# Patient Record
Sex: Female | Born: 1985 | Race: White | Hispanic: No | Marital: Single | Smoking: Former smoker
Health system: Southern US, Community
[De-identification: ages and names within clinical notes are randomized; demographics above are authoritative.]

## PROBLEM LIST (undated history)

## (undated) DIAGNOSIS — K589 Irritable bowel syndrome without diarrhea: Secondary | ICD-10-CM

## (undated) DIAGNOSIS — K2100 Gastro-esophageal reflux disease with esophagitis, without bleeding: Secondary | ICD-10-CM

## (undated) DIAGNOSIS — D649 Anemia, unspecified: Secondary | ICD-10-CM

## (undated) DIAGNOSIS — F319 Bipolar disorder, unspecified: Secondary | ICD-10-CM

---

## 2017-02-11 ENCOUNTER — Ambulatory Visit (HOSPITAL_COMMUNITY)
Admission: EM | Admit: 2017-02-11 | Discharge: 2017-02-11 | Disposition: A | Discharge status: Home / Self Care | Payer: Medicaid Other | Attending: Internal Medicine | Admitting: Internal Medicine

## 2017-02-11 ENCOUNTER — Encounter (HOSPITAL_COMMUNITY): Payer: Self-pay | Admitting: Emergency Medicine

## 2017-02-11 DIAGNOSIS — Z3202 Encounter for pregnancy test, result negative: Secondary | ICD-10-CM | POA: Diagnosis not present

## 2017-02-11 DIAGNOSIS — R109 Unspecified abdominal pain: Secondary | ICD-10-CM | POA: Diagnosis not present

## 2017-02-11 DIAGNOSIS — K219 Gastro-esophageal reflux disease without esophagitis: Secondary | ICD-10-CM

## 2017-02-11 DIAGNOSIS — R110 Nausea: Secondary | ICD-10-CM

## 2017-02-11 HISTORY — DX: Irritable bowel syndrome, unspecified: K58.9

## 2017-02-11 HISTORY — DX: Gastro-esophageal reflux disease with esophagitis: K21.0

## 2017-02-11 HISTORY — DX: Anemia, unspecified: D64.9

## 2017-02-11 HISTORY — DX: Bipolar disorder, unspecified: F31.9

## 2017-02-11 HISTORY — DX: Gastro-esophageal reflux disease with esophagitis, without bleeding: K21.00

## 2017-02-11 LAB — POCT PREGNANCY, URINE: Preg Test, Ur: NEGATIVE

## 2017-02-11 MED ORDER — RANITIDINE HCL 300 MG PO TABS: 300.0000 mg | ORAL_TABLET | Freq: Every day | ORAL | 2 refills | Status: DC

## 2017-02-11 MED ORDER — OMEPRAZOLE 40 MG PO CPDR: 40.0000 mg | DELAYED_RELEASE_CAPSULE | Freq: Two times a day (BID) | ORAL | 0 refills | Status: DC

## 2017-02-11 MED ORDER — ONDANSETRON 4 MG PO TBDP: 4.0000 mg | ORAL_TABLET | Freq: Three times a day (TID) | ORAL | 0 refills | Status: DC | PRN

## 2017-02-11 NOTE — Discharge Instructions (Signed)
Treating you for acid reflux, given Zantac, take one tablet every night at bedtime. Also started on Prilosec, take one tablet twice a day for the next 2 weeks. For your nausea, given Zofran, he Starzyk take 1 tablet underneath the tongue every 8 hours as needed. Recommend you follow-up with primary care provider for further evaluation and management of your condition.

## 2017-02-11 NOTE — ED Triage Notes (Signed)
The patient presented to the [facility] with a complaint of abdominal cramping and nausea after eating. The patient reported a hx of IBS and reflux.

## 2017-02-11 NOTE — ED Provider Notes (Signed)
CSN: [id number]     Arrival date & time 02/11/17  1424 History   None    Chief Complaint  Patient presents with  . Abdominal Pain   (Consider location/radiation/quality/duration/timing/severity/associated sxs/prior Treatment) 31 year old female presents to clinic with a one-week history of worsening acid reflux. She has a prior history of GERD, treated with Prilosec in the past. She also has a past history of H. pylori, is been treated for this disease twice as well.    Gastroesophageal Reflux  This is a recurrent problem. The current episode started more than 1 week ago. The problem occurs constantly. The problem has not changed since onset.Associated symptoms include abdominal pain. Pertinent negatives include no chest pain, no headaches and no shortness of breath. Exacerbated by: food, supine position. Relieved by: antacids, prilosec. The treatment provided mild relief.    Past Medical History:  Diagnosis Date  . Anemia   . Bipolar 1 disorder (HCC)   . IBS (irritable bowel syndrome)   . Reflux esophagitis    History reviewed. No pertinent surgical history. History reviewed. No pertinent family history. Social History  Substance Use Topics  . Smoking status: Never Smoker  . Smokeless tobacco: Never Used  . Alcohol use No   OB History    No data available     Review of Systems  Constitutional: Negative.   HENT: Negative.   Respiratory: Negative for cough, shortness of breath and wheezing.   Cardiovascular: Negative for chest pain and palpitations.  Gastrointestinal: Positive for abdominal pain and nausea.  Genitourinary: Negative.   Musculoskeletal: Negative.   Skin: Negative.   Neurological: Negative for dizziness and headaches.    Allergies  Ultram [tramadol] and Bactrim [sulfamethoxazole-trimethoprim]  Home Medications   Prior to Admission medications   Medication Sig Start Date End Date Taking? Authorizing Provider  Cariprazine HCl (VRAYLAR) 1.5 MG CAPS Take  by mouth.   Yes Historical Provider, MD  clonazePAM (KLONOPIN) 1 MG tablet Take 1 mg by mouth 2 (two) times daily.   Yes Historical Provider, MD  omeprazole (PRILOSEC) 40 MG capsule Take 1 capsule (40 mg total) by mouth 2 (two) times daily. 02/11/17 02/25/17  Dorena Bodo, NP  ondansetron (ZOFRAN ODT) 4 MG disintegrating tablet Take 1 tablet (4 mg total) by mouth every 8 (eight) hours as needed for nausea or vomiting. 02/11/17   Dorena Bodo, NP  ranitidine (ZANTAC) 300 MG tablet Take 1 tablet (300 mg total) by mouth at bedtime. 02/11/17   Dorena Bodo, NP   Meds Ordered and Administered this Visit  Medications - No data to display  BP 104/69 (BP Location: Left Arm)   Pulse 62   Temp 98 F (36.7 C) (Oral)   Resp 16   SpO2 100%  No data found.   Physical Exam  Constitutional: She is oriented to person, place, and time. She appears well-developed and well-nourished. No distress.  HENT:  Head: Normocephalic and atraumatic.  Right Ear: External ear normal.  Left Ear: External ear normal.  Eyes: Conjunctivae are normal. Right eye exhibits no discharge. Left eye exhibits no discharge.  Neck: Normal range of motion.  Abdominal: Soft. She exhibits no distension. There is no tenderness. There is no rebound.  Neurological: She is alert and oriented to person, place, and time.  Skin: Skin is warm and dry. Capillary refill takes less than 2 seconds. No rash noted. She is not diaphoretic. No erythema.  Psychiatric: She has a normal mood and affect. Her behavior is normal.  Nursing  note and vitals reviewed.   Urgent Care Course     Procedures (including critical care time)  Labs Review Labs Reviewed  POCT PREGNANCY, URINE    Imaging Review No results found.      MDM   1. Gastroesophageal reflux disease, esophagitis presence not specified    Treating for acid reflux, there is possibility of record of H. pylori, refer her back to primary care for evaluation of this  condition. Treating tonight with Prilosec, Zofran, and Zantac. Recommend avoiding spicy foods, follow-up with primary care or return to clinic as needed.     Dorena Bodo, NP 02/11/17 2216

## 2017-04-26 ENCOUNTER — Emergency Department (HOSPITAL_BASED_OUTPATIENT_CLINIC_OR_DEPARTMENT_OTHER)
Admission: EM | Admit: 2017-04-26 | Discharge: 2017-04-26 | Discharge status: Against Medical Advice | Payer: Medicaid Other | Attending: Emergency Medicine | Admitting: Emergency Medicine

## 2017-04-26 ENCOUNTER — Encounter (HOSPITAL_BASED_OUTPATIENT_CLINIC_OR_DEPARTMENT_OTHER): Payer: Self-pay | Admitting: *Deleted

## 2017-04-26 DIAGNOSIS — R102 Pelvic and perineal pain: Secondary | ICD-10-CM | POA: Insufficient documentation

## 2017-04-26 DIAGNOSIS — Z79899 Other long term (current) drug therapy: Secondary | ICD-10-CM | POA: Insufficient documentation

## 2017-04-26 LAB — URINALYSIS, ROUTINE W REFLEX MICROSCOPIC
BILIRUBIN URINE: NEGATIVE
GLUCOSE, UA: NEGATIVE mg/dL
HGB URINE DIPSTICK: NEGATIVE
Ketones, ur: NEGATIVE mg/dL
Leukocytes, UA: NEGATIVE
Nitrite: NEGATIVE
PROTEIN: NEGATIVE mg/dL
SPECIFIC GRAVITY, URINE: 1.025 (ref 1.005–1.030)
pH: 6 (ref 5.0–8.0)

## 2017-04-26 LAB — PREGNANCY, URINE: PREG TEST UR: NEGATIVE

## 2017-04-26 LAB — WET PREP, GENITAL
CLUE CELLS WET PREP: NONE SEEN
Sperm: NONE SEEN
Trich, Wet Prep: NONE SEEN
Yeast Wet Prep HPF POC: NONE SEEN

## 2017-04-26 NOTE — ED Provider Notes (Signed)
[facility] Provider Note   CSN: [id number] Arrival date & time: 04/26/17  1751 By signing my name below, I, Levon HedgerElizabeth Hall, attest that this documentation has been prepared under the direction and in the presence of Makyra Corprew, Neysa Bonitoana Duo, MD . Electronically Signed: Levon HedgerElizabeth Hall, Scribe. 04/26/2017. 7:42 PM.   History   Chief Complaint Chief Complaint  Patient presents with  . Vaginal Discharge   HPI Maralyn SagoSarah Pineda is a 31 y.o. female who presents to the Emergency Department complaining of 10/10 LLQ abdominal pain onset two days ago. Pain is exacerbated by lying down and with moment; no alleviating factors noted. She reports associated nausea, malodorous vaginal discharge, increased urinary frequency and decreased urinary output. She has taken AZO with no significant relief of pain. Per pt, she has had bacterial vaginosis several times in the past and this feels similar. Pt denies any fevers, back pain or vomiting. Pt has no other acute complaints or associated symptoms at this time.She denies any concerns for STDs. Is sexually active with same sex partner.  The history is provided by the patient. No language interpreter was used.   Past Medical History:  Diagnosis Date  . Anemia   . Bipolar 1 disorder (HCC)   . IBS (irritable bowel syndrome)   . Reflux esophagitis     There are no active problems to display for this patient.   History reviewed. No pertinent surgical history.  OB History    No data available       Home Medications    Prior to Admission medications   Medication Sig Start Date End Date Taking? Authorizing Provider  Cariprazine HCl (VRAYLAR) 1.5 MG CAPS Take by mouth.   Yes [provider]  clonazePAM (KLONOPIN) 1 MG tablet Take 1 mg by mouth 2 (two) times daily.   Yes [provider]  GABAPENTIN PO Take by mouth.   Yes [provider]  NAPROXEN PO Take by mouth.   Yes [provider]  omeprazole (PRILOSEC) 40 MG capsule  Take 1 capsule (40 mg total) by mouth 2 (two) times daily. 02/11/17 04/26/17 Yes Dorena BodoKennard, Lawrence, NP  ranitidine (ZANTAC) 300 MG tablet Take 1 tablet (300 mg total) by mouth at bedtime. 02/11/17  Yes Dorena BodoKennard, Lawrence, NP  ondansetron (ZOFRAN ODT) 4 MG disintegrating tablet Take 1 tablet (4 mg total) by mouth every 8 (eight) hours as needed for nausea or vomiting. 02/11/17   Dorena BodoKennard, Lawrence, NP    Family History No family history on file.  Social History Social History  Substance Use Topics  . Smoking status: Never Smoker  . Smokeless tobacco: Never Used  . Alcohol use No    Allergies   Ultram [tramadol] and Bactrim [sulfamethoxazole-trimethoprim]  Review of Systems Review of Systems  Gastrointestinal: Positive for abdominal pain and nausea. Negative for vomiting.  Genitourinary: Positive for decreased urine volume, frequency and vaginal discharge.  Musculoskeletal: Negative for back pain.  All other systems reviewed and are negative.  Physical Exam Updated Vital Signs BP 110/71 (BP Location: Right Arm)   Pulse 69   Temp 99 F (37.2 C) (Oral)   Resp 16   Ht 5\' 6"  (1.676 m)   Wt 80.4 kg (177 lb 3 oz)   SpO2 100%   BMI 28.60 kg/m   Physical Exam Physical Exam  Nursing note and vitals reviewed. Constitutional: Well developed, well nourished, non-toxic, and in no acute distress Head: Normocephalic and atraumatic.  Mouth/Throat: Oropharynx is clear and moist.  Neck: Normal  range of motion. Neck supple.  Cardiovascular: Normal rate and regular rhythm.   Pulmonary/Chest: Effort normal and breath sounds normal.  Abdominal: Soft. Left lower pelvic tenderness. No CVA tenderness. There is no rebound and no guarding.  Pelvic: Normal external genitalia. Normal internal genitalia. No discharge. No blood within the vagina. No cervical motion tenderness. No adnexal masses. There is left-sided adnexal tenderness. Musculoskeletal: Normal range of motion.  Neurological: Alert, no  facial droop, fluent speech, moves all extremities symmetrically Skin: Skin is warm and dry.  Psychiatric: Cooperative   ED Treatments / Results  DIAGNOSTIC STUDIES:  Oxygen Saturation is 97% on RA, normal by my interpretation.    COORDINATION OF CARE:  7:19 PM Discussed treatment plan with pt at bedside and pt agreed to plan.   Labs (all labs ordered are listed, but only abnormal results are displayed) Labs Reviewed  WET PREP, GENITAL - Abnormal; Notable for the following:       Result Value   WBC, Wet Prep HPF POC MANY (*)    All other components within normal limits  URINALYSIS, ROUTINE W REFLEX MICROSCOPIC  PREGNANCY, URINE  GC/CHLAMYDIA PROBE AMP () NOT AT [facility]    EKG  EKG Interpretation None       Radiology No results found.  Procedures Procedures (including critical care time)  Medications Ordered in ED Medications - No data to display   Initial Impression / Assessment and Plan / ED Course  I have reviewed the triage vital signs and the nursing notes.  Pertinent labs & imaging results that were available during my care of the patient were reviewed by me and considered in my medical decision making (see chart for details).     31 year old female who presents with left-sided pelvic pain and vaginal discharge over the past 2 days. nontoxic in no acute distress with stable vital signs. Abdomen is soft and nonsurgical. Primarily pain is in the left adnexa. No cervical motion tenderness. GC chlamydia testing is pending although she is not concerned for any STDs. She has no evidence of vaginitis on her wet prep. Discussed pelvic ultrasound, to rule out ovarian cyst or ovarian torsion. Discussed transfer to [facility] to get this done if there is no ultrasound availability tonight. Patient states that she does not want to have ultrasound done tonight and would rather go home and come back tomorrow if needed. Discussed that worse case scenario this  could be infection versus ovarian torsion, but could be life-threatening or affect her fertility. She expressed understanding of this. I also attempted to order ultrasound for her to be scheduled for tomorrow, but patient eloped prior to me following up with her.   Final Clinical Impressions(s) / ED Diagnoses   Final diagnoses:  Pelvic pain    New Prescriptions New Prescriptions   No medications on file   I personally performed the services described in this documentation, which was scribed in my presence. The recorded information has been reviewed and is accurate.    Lavera Guise, MD 04/26/17 2110

## 2017-04-26 NOTE — ED Triage Notes (Signed)
Vaginal discharge with an odor x 2 days. Abdominal pain. Bloated.

## 2017-04-26 NOTE — ED Notes (Signed)
Pt eating snack in lobby

## 2017-04-26 NOTE — ED Notes (Signed)
Per EDP pt didn't want to have a scheduled US done tonight, states will return tomorrow

## 2017-04-27 LAB — GC/CHLAMYDIA PROBE AMP (~~LOC~~) NOT AT ARMC
Chlamydia: NEGATIVE
NEISSERIA GONORRHEA: NEGATIVE

## 2018-03-15 ENCOUNTER — Other Ambulatory Visit: Payer: Self-pay

## 2018-03-15 ENCOUNTER — Emergency Department (HOSPITAL_BASED_OUTPATIENT_CLINIC_OR_DEPARTMENT_OTHER)
Admission: EM | Admit: 2018-03-15 | Discharge: 2018-03-15 | Disposition: A | Discharge status: Home / Self Care | Payer: Medicaid Other | Attending: Emergency Medicine | Admitting: Emergency Medicine

## 2018-03-15 ENCOUNTER — Encounter (HOSPITAL_BASED_OUTPATIENT_CLINIC_OR_DEPARTMENT_OTHER): Payer: Self-pay | Admitting: *Deleted

## 2018-03-15 DIAGNOSIS — Z79899 Other long term (current) drug therapy: Secondary | ICD-10-CM | POA: Diagnosis not present

## 2018-03-15 DIAGNOSIS — Z711 Person with feared health complaint in whom no diagnosis is made: Secondary | ICD-10-CM

## 2018-03-15 DIAGNOSIS — N898 Other specified noninflammatory disorders of vagina: Secondary | ICD-10-CM | POA: Diagnosis not present

## 2018-03-15 DIAGNOSIS — R350 Frequency of micturition: Secondary | ICD-10-CM | POA: Diagnosis present

## 2018-03-15 LAB — URINALYSIS, ROUTINE W REFLEX MICROSCOPIC
Bilirubin Urine: NEGATIVE
Glucose, UA: NEGATIVE mg/dL
HGB URINE DIPSTICK: NEGATIVE
Ketones, ur: NEGATIVE mg/dL
Leukocytes, UA: NEGATIVE
Nitrite: NEGATIVE
PROTEIN: NEGATIVE mg/dL
Specific Gravity, Urine: >1.03 — ABNORMAL HIGH (ref 1.005–1.030)
pH: 6 (ref 5.0–8.0)

## 2018-03-15 LAB — WET PREP, GENITAL
CLUE CELLS WET PREP: NONE SEEN
Sperm: NONE SEEN
TRICH WET PREP: NONE SEEN
Yeast Wet Prep HPF POC: NONE SEEN

## 2018-03-15 LAB — PREGNANCY, URINE: PREG TEST UR: NEGATIVE

## 2018-03-15 MED ORDER — CEFTRIAXONE SODIUM 250 MG IJ SOLR
250.0000 mg | Freq: Once | INTRAMUSCULAR | Status: AC
  Administered 2018-03-15: 250 mg via INTRAMUSCULAR
  Filled 2018-03-15: qty 250

## 2018-03-15 MED ORDER — AZITHROMYCIN 250 MG PO TABS
1000.0000 mg | ORAL_TABLET | Freq: Once | ORAL | Status: AC
Start: 2018-03-15 — End: 2018-03-15
  Administered 2018-03-15: 1000 mg via ORAL
  Filled 2018-03-15: qty 4

## 2018-03-15 MED ORDER — FLUCONAZOLE 150 MG PO TABS: 150.0000 mg | ORAL_TABLET | Freq: Every day | ORAL | 0 refills | Status: AC

## 2018-03-15 NOTE — ED Provider Notes (Signed)
[facility] EMERGENCY DEPARTMENT Provider Note   CSN: [id number] Arrival date & time: 03/15/18  1120     History   Chief Complaint Chief Complaint  Patient presents with  . Vaginal Discharge    HPI Melanie Pineda is a 32 y.o. female.  HPI  Melanie Pineda is a 32yo female with a history of IBS and bipolar disorder who presents to the emergency department for evaluation of urinary frequency, urgency and vaginal irritation.  Patient reports that her symptoms began 3 days ago.  States she has had to go to the bathroom more often, but only peeing a few drops.  States that she also has some mild suprapubic discomfort with urination.  Has had some vaginal irritation and itching.  She has not noticed any new vaginal discharge.  Has had some nausea, no vomiting.  Denies fevers, chills, flank pain, vaginal bleeding, hematuria, diarrhea, melena, hematochezia, lightheadedness or syncope.  States that she was recently tested for GC/chlamydia, HIV and syphilis at her gynecologist office a month ago.  Reports that she has one female sexual partner whom she denies regular condom use.  States that she would like to be tested for chlamydia and gonorrhea today as she is not sure if her partner is seeing other women.  She is not taking birth control, last menstrual period started 5/16.  Reports that she has had repeated episodes of BV in the past and has needed treatment four times this year.  Past Medical History:  Diagnosis Date  . Anemia   . Bipolar 1 disorder (HCC)   . IBS (irritable bowel syndrome)   . Reflux esophagitis     There are no active problems to display for this patient.   History reviewed. No pertinent surgical history.   OB History   None      Home Medications    Prior to Admission medications   Medication Sig Start Date End Date Taking? Authorizing Provider  Cariprazine HCl (VRAYLAR) 1.5 MG CAPS Take by mouth.    [provider]  clonazePAM (KLONOPIN) 1 MG tablet  Take 1 mg by mouth 2 (two) times daily.    [provider]  GABAPENTIN PO Take by mouth.    [provider]  NAPROXEN PO Take by mouth.    [provider]  omeprazole (PRILOSEC) 40 MG capsule Take 1 capsule (40 mg total) by mouth 2 (two) times daily. 02/11/17 04/26/17  Dorena Bodo, NP  ondansetron (ZOFRAN ODT) 4 MG disintegrating tablet Take 1 tablet (4 mg total) by mouth every 8 (eight) hours as needed for nausea or vomiting. 02/11/17   Dorena Bodo, NP  ranitidine (ZANTAC) 300 MG tablet Take 1 tablet (300 mg total) by mouth at bedtime. 02/11/17   Dorena Bodo, NP    Family History No family history on file.  Social History Social History   Tobacco Use  . Smoking status: Never Smoker  . Smokeless tobacco: Never Used  Substance Use Topics  . Alcohol use: No  . Drug use: Yes    Types: Marijuana     Allergies   Ultram [tramadol] and Bactrim [sulfamethoxazole-trimethoprim]   Review of Systems Review of Systems  Constitutional: Negative for chills and fever.  Eyes: Negative for visual disturbance.  Respiratory: Negative for shortness of breath.   Cardiovascular: Negative for chest pain.  Gastrointestinal: Positive for abdominal pain (suprapubic abdominal pain with urination) and nausea. Negative for blood in stool and vomiting.  Genitourinary: Positive for frequency and urgency. Negative  for difficulty urinating, dysuria, flank pain, genital sores, hematuria, menstrual problem, vaginal bleeding and vaginal discharge.  Musculoskeletal: Negative for back pain.  Skin: Negative for rash.  Neurological: Negative for syncope and light-headedness.  Psychiatric/Behavioral: Negative for agitation.     Physical Exam Updated Vital Signs BP (!) 116/96   Pulse 74   Temp 98.3 F (36.8 C) (Oral)   Resp 18   LMP 03/01/2018   SpO2 100%   Physical Exam  Constitutional: She is oriented to person, place, and time. She appears well-developed and  well-nourished. No distress.  Sitting at bedside in no apparent distress, nontoxic-appearing.  HENT:  Head: Normocephalic and atraumatic.  Mouth/Throat: Oropharynx is clear and moist. No oropharyngeal exudate.  Eyes: Pupils are equal, round, and reactive to light. Right eye exhibits no discharge. Left eye exhibits no discharge.  Neck: Neck supple.  Pulmonary/Chest: Effort normal. No respiratory distress.  Abdominal: Soft. Bowel sounds are normal. There is no tenderness.  Abdomen soft and nondistended.  Nontender to palpation. No guarding or rigidity.  No CVA tenderness.  Genitourinary:  Genitourinary Comments: Chaperone present for exam. White thick discharge present. No CMT. No adnexal masses, tenderness, or fullness.  No bleeding within vaginal vault.  Neurological: She is alert and oriented to person, place, and time. Coordination normal.  Skin: Skin is warm and dry. She is not diaphoretic.  Psychiatric: She has a normal mood and affect. Her behavior is normal.  Nursing note and vitals reviewed.    ED Treatments / Results  Labs (all labs ordered are listed, but only abnormal results are displayed) Labs Reviewed  WET PREP, GENITAL - Abnormal; Notable for the following components:      Result Value   WBC, Wet Prep HPF POC MANY (*)    All other components within normal limits  URINALYSIS, ROUTINE W REFLEX MICROSCOPIC - Abnormal; Notable for the following components:   Specific Gravity, Urine >1.030 (*)    All other components within normal limits  PREGNANCY, URINE  GC/CHLAMYDIA PROBE AMP ([facility]) NOT AT [facility]    EKG None  Radiology No results found.  Procedures Procedures (including critical care time)  Medications Ordered in ED Medications  cefTRIAXone (ROCEPHIN) injection 250 mg (250 mg Intramuscular Given 03/15/18 1316)  azithromycin (ZITHROMAX) tablet 1,000 mg (1,000 mg Oral Given 03/15/18 1315)     Initial Impression / Assessment and Plan / ED Course  I have  reviewed the triage vital signs and the nursing notes.  Pertinent labs & imaging results that were available during my care of the patient were reviewed by me and considered in my medical decision making (see chart for details).     UA without evidence of infection. Urine pregnancy negative. Wet prep with many WBCs, negative for trich, yeast and clue cells. She has GC/Chlamydia cultures pending. She declines testing for HIV, syphilis. Patient wishes to be treated with prophylactic Rocephin and azithromycin. Although wet prep negative for yeast, will treat with diflucan for potential yeast infection given exam findings of white thick discharge and vaginal itching complaint. No CMT or adnexal tenderness to suggest PID. Patient has been counseled that she has GC/chlamydia testing which is pending and she will need to inform all sexual partners if positive. Have given her information to the health department for future std concerns. Discussed return precautions and patient agrees and voices understanding to the above plan.   Final Clinical Impressions(s) / ED Diagnoses   Final diagnoses:  Concern about STD in female without  diagnosis  Vaginal itching    ED Discharge Orders        Ordered    fluconazole (DIFLUCAN) 150 MG tablet  Daily     03/15/18 1250       Kellie Shropshire, [place] 03/15/18 1743    Tegeler, Canary Brim, MD 03/16/18 [phone]

## 2018-03-15 NOTE — ED Triage Notes (Signed)
Vaginal redness and itching. States hx of BV frequently.

## 2018-03-15 NOTE — Discharge Instructions (Addendum)
You do not have a urinary tract infection.  You were treated for chlamydia and gonorrhea in the emergency department today.  Your testing will return in 2 to 3 days.  If positive you will receive a phone call.   I have listed information to the health department below.  Please follow-up for any future STD concerns.  Please take Diflucan for potential yeast infection.  Return to the ER if you have any new or concerning symptoms like vomiting that will not stop, fever greater than 100.4 F, worsening abdominal pain.

## 2018-03-15 NOTE — ED Notes (Signed)
ED Provider at bedside. 

## 2018-03-18 LAB — GC/CHLAMYDIA PROBE AMP (~~LOC~~) NOT AT ARMC
CHLAMYDIA, DNA PROBE: NEGATIVE
NEISSERIA GONORRHEA: NEGATIVE

## 2018-03-20 ENCOUNTER — Encounter (HOSPITAL_BASED_OUTPATIENT_CLINIC_OR_DEPARTMENT_OTHER): Payer: Self-pay | Admitting: Emergency Medicine

## 2018-03-20 ENCOUNTER — Emergency Department (HOSPITAL_BASED_OUTPATIENT_CLINIC_OR_DEPARTMENT_OTHER): Payer: No Typology Code available for payment source

## 2018-03-20 ENCOUNTER — Emergency Department (HOSPITAL_BASED_OUTPATIENT_CLINIC_OR_DEPARTMENT_OTHER)
Admission: EM | Admit: 2018-03-20 | Discharge: 2018-03-20 | Disposition: A | Discharge status: Home / Self Care | Payer: No Typology Code available for payment source | Attending: Emergency Medicine | Admitting: Emergency Medicine

## 2018-03-20 ENCOUNTER — Other Ambulatory Visit: Payer: Self-pay

## 2018-03-20 DIAGNOSIS — Y998 Other external cause status: Secondary | ICD-10-CM | POA: Insufficient documentation

## 2018-03-20 DIAGNOSIS — S161XXA Strain of muscle, fascia and tendon at neck level, initial encounter: Secondary | ICD-10-CM

## 2018-03-20 DIAGNOSIS — M7918 Myalgia, other site: Secondary | ICD-10-CM | POA: Diagnosis not present

## 2018-03-20 DIAGNOSIS — S199XXA Unspecified injury of neck, initial encounter: Secondary | ICD-10-CM | POA: Diagnosis present

## 2018-03-20 DIAGNOSIS — Z79899 Other long term (current) drug therapy: Secondary | ICD-10-CM | POA: Diagnosis not present

## 2018-03-20 DIAGNOSIS — F319 Bipolar disorder, unspecified: Secondary | ICD-10-CM | POA: Diagnosis not present

## 2018-03-20 DIAGNOSIS — Y9389 Activity, other specified: Secondary | ICD-10-CM | POA: Insufficient documentation

## 2018-03-20 DIAGNOSIS — M791 Myalgia, unspecified site: Secondary | ICD-10-CM

## 2018-03-20 DIAGNOSIS — Y92410 Unspecified street and highway as the place of occurrence of the external cause: Secondary | ICD-10-CM | POA: Insufficient documentation

## 2018-03-20 MED ORDER — NAPROXEN 250 MG PO TABS
500.0000 mg | ORAL_TABLET | Freq: Once | ORAL | Status: AC
  Administered 2018-03-20: 500 mg via ORAL
  Filled 2018-03-20: qty 2

## 2018-03-20 MED ORDER — METHOCARBAMOL 500 MG PO TABS
500.0000 mg | ORAL_TABLET | Freq: Once | ORAL | Status: AC
  Administered 2018-03-20: 500 mg via ORAL
  Filled 2018-03-20: qty 1

## 2018-03-20 MED ORDER — NAPROXEN 500 MG PO TABS: 500.0000 mg | ORAL_TABLET | Freq: Two times a day (BID) | ORAL | 0 refills | Status: DC | PRN

## 2018-03-20 MED ORDER — METHOCARBAMOL 500 MG PO TABS: 500.0000 mg | ORAL_TABLET | Freq: Two times a day (BID) | ORAL | 0 refills | Status: DC | PRN
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
## 2018-03-20 NOTE — ED Notes (Signed)
NAD at this time. Pt is stable and going home.  
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
## 2018-04-15 ENCOUNTER — Emergency Department (HOSPITAL_BASED_OUTPATIENT_CLINIC_OR_DEPARTMENT_OTHER)
Admission: EM | Admit: 2018-04-15 | Discharge: 2018-04-15 | Discharge status: Against Medical Advice | Payer: Medicaid Other | Attending: Emergency Medicine | Admitting: Emergency Medicine

## 2018-04-15 ENCOUNTER — Other Ambulatory Visit: Payer: Self-pay

## 2018-04-15 ENCOUNTER — Encounter (HOSPITAL_BASED_OUTPATIENT_CLINIC_OR_DEPARTMENT_OTHER): Payer: Self-pay | Admitting: *Deleted

## 2018-04-15 DIAGNOSIS — Z5321 Procedure and treatment not carried out due to patient leaving prior to being seen by health care provider: Secondary | ICD-10-CM | POA: Insufficient documentation

## 2018-04-15 DIAGNOSIS — R300 Dysuria: Secondary | ICD-10-CM | POA: Insufficient documentation

## 2018-04-15 LAB — URINALYSIS, ROUTINE W REFLEX MICROSCOPIC
Bilirubin Urine: NEGATIVE
Glucose, UA: NEGATIVE mg/dL
Hgb urine dipstick: NEGATIVE
Ketones, ur: NEGATIVE mg/dL
Leukocytes, UA: NEGATIVE
Nitrite: NEGATIVE
Protein, ur: NEGATIVE mg/dL
Specific Gravity, Urine: <1.005 — ABNORMAL LOW (ref 1.005–1.030)
pH: 7 (ref 5.0–8.0)

## 2018-04-15 NOTE — ED Triage Notes (Signed)
Dysuria and frequency. States she was seen 2 weeks ago with the same symptoms but she did not have a UTI.

## 2018-04-16 ENCOUNTER — Encounter (HOSPITAL_BASED_OUTPATIENT_CLINIC_OR_DEPARTMENT_OTHER): Payer: Self-pay

## 2018-04-16 ENCOUNTER — Other Ambulatory Visit: Payer: Self-pay

## 2018-04-16 ENCOUNTER — Emergency Department (HOSPITAL_BASED_OUTPATIENT_CLINIC_OR_DEPARTMENT_OTHER)
Admission: EM | Admit: 2018-04-16 | Discharge: 2018-04-16 | Disposition: A | Discharge status: Home / Self Care | Payer: Medicaid Other | Attending: Emergency Medicine | Admitting: Emergency Medicine

## 2018-04-16 DIAGNOSIS — N730 Acute parametritis and pelvic cellulitis: Secondary | ICD-10-CM

## 2018-04-16 DIAGNOSIS — N739 Female pelvic inflammatory disease, unspecified: Secondary | ICD-10-CM | POA: Insufficient documentation

## 2018-04-16 DIAGNOSIS — Z79899 Other long term (current) drug therapy: Secondary | ICD-10-CM | POA: Diagnosis not present

## 2018-04-16 DIAGNOSIS — R102 Pelvic and perineal pain: Secondary | ICD-10-CM | POA: Diagnosis present

## 2018-04-16 DIAGNOSIS — R112 Nausea with vomiting, unspecified: Secondary | ICD-10-CM

## 2018-04-16 DIAGNOSIS — R300 Dysuria: Secondary | ICD-10-CM

## 2018-04-16 LAB — URINALYSIS, ROUTINE W REFLEX MICROSCOPIC
Bilirubin Urine: NEGATIVE
Glucose, UA: NEGATIVE mg/dL
Hgb urine dipstick: NEGATIVE
Ketones, ur: NEGATIVE mg/dL
LEUKOCYTES UA: NEGATIVE
NITRITE: NEGATIVE
PH: 6 (ref 5.0–8.0)
PROTEIN: NEGATIVE mg/dL
Specific Gravity, Urine: <1.005 — ABNORMAL LOW (ref 1.005–1.030)

## 2018-04-16 LAB — PREGNANCY, URINE: PREG TEST UR: NEGATIVE

## 2018-04-16 MED ORDER — ONDANSETRON 4 MG PO TBDP: 4.0000 mg | ORAL_TABLET | Freq: Three times a day (TID) | ORAL | 0 refills | Status: DC | PRN

## 2018-04-16 MED ORDER — ONDANSETRON 4 MG PO TBDP
4.0000 mg | ORAL_TABLET | Freq: Once | ORAL | Status: AC
  Administered 2018-04-16: 4 mg via ORAL
  Filled 2018-04-16: qty 1

## 2018-04-16 MED ORDER — DOXYCYCLINE HYCLATE 100 MG PO CAPS: 100.0000 mg | ORAL_CAPSULE | Freq: Two times a day (BID) | ORAL | 0 refills | Status: DC

## 2018-04-16 MED ORDER — CEFTRIAXONE SODIUM 250 MG IJ SOLR
250.0000 mg | Freq: Once | INTRAMUSCULAR | Status: AC
  Administered 2018-04-16: 250 mg via INTRAMUSCULAR
  Filled 2018-04-16: qty 250

## 2018-04-16 MED ORDER — AZITHROMYCIN 250 MG PO TABS
1000.0000 mg | ORAL_TABLET | Freq: Once | ORAL | Status: AC
  Administered 2018-04-16: 1000 mg via ORAL
  Filled 2018-04-16: qty 4

## 2018-04-16 MED FILL — DOXYCYCLINE HYCLATE 100 MG: 100 | 14 days supply | Qty: 28 | Fill #0

## 2018-04-16 NOTE — Discharge Instructions (Addendum)
You have been treated for gonorrhea, chlamydia, and other pelvic organ infectious organisms in the ER, continue taking Doxycycline as directed until completed in order to completely treat the presumed Pelvic Inflammatory Disease. NO SEXUAL INTERCOURSE UNTIL YOU'RE FINISHED WITH THE ANTIBIOTICS, THIS WILL INVALIDATE YOUR TREATMENT HERE. Alternate between tylenol and ibuprofen as needed for pain. Use zofran as directed as needed for nausea. Stay well hydrated and get plenty of rest. Your symptoms could also be due to something called interstitial cystitis, so if your symptoms persist after this treatment then you should follow up with your OBGYN to discuss further possible testing.  Follow up with your regular OBGYN in 1 week for recheck of symptoms. [facility] (called [facility]) for any changes or worsening symptoms.

## 2018-04-16 NOTE — ED Provider Notes (Signed)
[facility] EMERGENCY DEPARTMENT Provider Note   CSN: [id number] Arrival date & time: 04/16/18  1158     History   Chief Complaint Chief Complaint  Patient presents with  . Urinary Frequency    HPI Melanie Pineda is a 32 y.o. female with a PMHx of anemia, bipolar 1 disorder, IBS, and reflux esophagitis, who presents to the ED with complaints of ongoing dysuria and pelvic pain that has been going on since her last visit on 03/15/18; she states that it transiently improved slightly but then got worse again about 4 days ago.  Chart review reveals that pt was seen 03/15/18 for similar complaints, had U/A that was completely unremarkable, pelvic was performed and wet prep showed many WBCs but negative otherwise, and GC/CT testing was negative.  She was given azithromycin and rocephin empirically, and discharged home with diflucan given her symptoms of vaginal itching and thick white d/c on pelvic exam.  She states that she never took the diflucan because "she didn't need it"; reports that initially her symptoms were a little better for "a while" but then a few days ago started coming back.  Symptoms include dysuria, urinary frequency and urgency, malodorous urine, pelvic/abdominal pain, nausea, and 5 episodes of nonbloody nonbilious emesis today.  She describes her pain as 7/10 constant crampy lower abdominal pain that radiates into her lower back, worse with urination, and somewhat improved with cranberry juice and cranberry pills.  She is sexually active with one female partner.  LMP was 03/28/2018.  She denies fevers, chills, CP, SOB, diarrhea/constipation, obstipation, melena, hematochezia, hematemesis, hematuria, vaginal itching, genital sores, vaginal bleeding/discharge, myalgias, arthralgias, numbness, tingling, focal weakness, or any other complaints at this time. Denies recent travel, sick contacts, suspicious food intake, EtOH use, NSAID use, or prior abd surgeries.   The history is provided  by the patient and medical records. No language interpreter was used.  Urinary Frequency  Associated symptoms include abdominal pain. Pertinent negatives include no chest pain and no shortness of breath.    Past Medical History:  Diagnosis Date  . Anemia   . Bipolar 1 disorder (HCC)   . IBS (irritable bowel syndrome)   . Reflux esophagitis     There are no active problems to display for this patient.   History reviewed. No pertinent surgical history.   OB History   None      Home Medications    Prior to Admission medications   Medication Sig Start Date End Date Taking? Authorizing Provider  busPIRone (BUSPAR) 10 MG tablet Take 10 mg by mouth 2 (two) times daily.    [provider]  Cariprazine HCl (VRAYLAR) 1.5 MG CAPS Take by mouth.    [provider]  clonazePAM (KLONOPIN) 1 MG tablet Take 1 mg by mouth 2 (two) times daily.    [provider]  GABAPENTIN PO Take by mouth.    [provider]  methocarbamol (ROBAXIN) 500 MG tablet Take 1 tablet (500 mg total) by mouth 2 (two) times daily as needed (Muscle soreness). 03/20/18   Ward, Chase PicketJaime Pilcher, PA-C  naproxen (NAPROSYN) 500 MG tablet Take 1 tablet (500 mg total) by mouth 2 (two) times daily as needed. 03/20/18   Ward, Chase PicketJaime Pilcher, PA-C  omeprazole (PRILOSEC) 40 MG capsule Take 1 capsule (40 mg total) by mouth 2 (two) times daily. 02/11/17 04/26/17  Dorena BodoKennard, Lawrence, NP  ondansetron (ZOFRAN ODT) 4 MG disintegrating tablet Take 1 tablet (4 mg total) by mouth every 8 (eight)  hours as needed for nausea or vomiting. 02/11/17   Dorena Bodo, NP  ranitidine (ZANTAC) 300 MG tablet Take 1 tablet (300 mg total) by mouth at bedtime. 02/11/17   Dorena Bodo, NP    Family History No family history on file.  Social History Social History   Tobacco Use  . Smoking status: Never Smoker  . Smokeless tobacco: Never Used  Substance Use Topics  . Alcohol use: No  . Drug use: Yes    Types:  Marijuana     Allergies   Ultram [tramadol] and Bactrim [sulfamethoxazole-trimethoprim]   Review of Systems Review of Systems  Constitutional: Negative for chills and fever.  Respiratory: Negative for shortness of breath.   Cardiovascular: Negative for chest pain.  Gastrointestinal: Positive for abdominal pain, nausea and vomiting. Negative for blood in stool, constipation and diarrhea.  Genitourinary: Positive for dysuria, frequency and urgency. Negative for genital sores, hematuria, vaginal bleeding, vaginal discharge and vaginal pain.       +malodorous urine  Musculoskeletal: Negative for arthralgias and myalgias.  Skin: Negative for color change.  Allergic/Immunologic: Negative for immunocompromised state.  Neurological: Negative for weakness and numbness.  Psychiatric/Behavioral: Negative for confusion.   All other systems reviewed and are negative for acute change except as noted in the HPI.    Physical Exam Updated Vital Signs BP 112/76 (BP Location: Left Arm)   Pulse 66   Temp 98.1 F (36.7 C) (Oral)   Resp 20   Ht 5\' 6"  (1.676 m)   Wt 68 kg (150 lb)   LMP 03/28/2018   SpO2 100%   BMI 24.21 kg/m   Physical Exam  Constitutional: She is oriented to person, place, and time. Vital signs are normal. She appears well-developed and well-nourished.  Non-toxic appearance. No distress.  Afebrile, nontoxic, NAD  HENT:  Head: Normocephalic and atraumatic.  Mouth/Throat: Oropharynx is clear and moist and mucous membranes are normal.  Eyes: Conjunctivae and EOM are normal. Right eye exhibits no discharge. Left eye exhibits no discharge.  Neck: Normal range of motion. Neck supple.  Cardiovascular: Normal rate, regular rhythm, normal heart sounds and intact distal pulses. Exam reveals no gallop and no friction rub.  No murmur heard. Pulmonary/Chest: Effort normal and breath sounds normal. No respiratory distress. She has no decreased breath sounds. She has no wheezes. She has  no rhonchi. She has no rales.  Abdominal: Soft. Normal appearance and bowel sounds are normal. She exhibits no distension. There is tenderness in the suprapubic area. There is no rigidity, no rebound, no guarding, no CVA tenderness, no tenderness at McBurney's point and negative Murphy's sign.  Soft, nondistended, +BS throughout, with very mild suprapubic TTP, no r/g/r, neg murphy's, neg mcburney's, no frank CVA TTP   Genitourinary:  Genitourinary Comments: Pt declined  Musculoskeletal: Normal range of motion.  Neurological: She is alert and oriented to person, place, and time. She has normal strength. No sensory deficit.  Skin: Skin is warm, dry and intact. No rash noted.  Psychiatric: She has a normal mood and affect.  Nursing note and vitals reviewed.    ED Treatments / Results  Labs (all labs ordered are listed, but only abnormal results are displayed) Labs Reviewed  URINALYSIS, ROUTINE W REFLEX MICROSCOPIC - Abnormal; Notable for the following components:      Result Value   Specific Gravity, Urine <1.005 (*)    All other components within normal limits  URINE CULTURE  PREGNANCY, URINE   Labs from 03/15/18:  Ref. Range  04/26/2017 19:17 03/15/2018 00:00  Chlamydia Unknown  Negative  Neisseria gonorrhea Unknown  Negative  Yeast Wet Prep HPF POC Latest Ref Range: NONE SEEN  NONE SEEN   Trich, Wet Prep Latest Ref Range: NONE SEEN  NONE SEEN   Clue Cells Wet Prep HPF POC Latest Ref Range: NONE SEEN  NONE SEEN   WBC, Wet Prep HPF POC Latest Ref Range: NONE SEEN  MANY (A)    EKG None  Radiology No results found.  Procedures Procedures (including critical care time)  Medications Ordered in ED Medications  azithromycin (ZITHROMAX) tablet 1,000 mg (1,000 mg Oral Given 04/16/18 1542)    And  cefTRIAXone (ROCEPHIN) injection 250 mg (250 mg Intramuscular Given 04/16/18 1545)  ondansetron (ZOFRAN-ODT) disintegrating tablet 4 mg (4 mg Oral Given 04/16/18 1542)     Initial Impression /  Assessment and Plan / ED Course  I have reviewed the triage vital signs and the nursing notes.  Pertinent labs & imaging results that were available during my care of the patient were reviewed by me and considered in my medical decision making (see chart for details).     32 y.o. female here with ongoing pelvic pain since 03/15/18 visit to ED, states it got better for a little while and then returned a few days ago. Reports pelvic pain, UTI symptoms, n/v. On exam, very minimal suprapubic TTP, nonperitoneal, no other focal TTP in abdomen; no frank CVA TTP. Upreg neg. U/A completely unremarkable. Work up previously on 03/15/18 visit showed wet prep with +WBCs but otherwise negative, GC/CT negative, U/A unremarkable; she was treated empirically with azithro/rocephin and sent home with diflucan since her pelvic exam showed thick white d/c and she reported some itching. Overall, symptoms could be due to either PID vs interstitial cystitis. Empiric tx for PID would be reasonable at this point, despite negative STD testing in the past, but given her somewhat chronic pelvic pain and symptoms. Pt declines wanting repeat pelvic today, I think this is fine since she has already had one recently and this would likely not change our management today. Will give azithro/rocephin again today, zofran, and send home with 2wks of doxy as well as zofran rx. Doubt need for further labs/imaging at this time, doubt other acute emergent pathology. Pt tolerating PO well here prior to d/c. Advised OTC remedies for symptomatic relief, and f/up with OBGYN in 1wk. I explained the diagnosis and have given explicit precautions to return to the ER including for any other new or worsening symptoms. The patient understands and accepts the medical plan as it's been dictated and I have answered their questions. Discharge instructions concerning home care and prescriptions have been given. The patient is STABLE and is discharged to home in good  condition.    Final Clinical Impressions(s) / ED Diagnoses   Final diagnoses:  Dysuria  Pelvic pain  Nausea and vomiting in adult patient  PID (acute pelvic inflammatory disease)    ED Discharge Orders        Ordered    ondansetron (ZOFRAN ODT) 4 MG disintegrating tablet  Every 8 hours PRN     04/16/18 1452    doxycycline (VIBRAMYCIN) 100 MG capsule  2 times daily     04/16/18 [address] [place], [place] 04/16/18 1547    Tilden Fossa, MD 04/17/18 [phone]

## 2018-04-16 NOTE — ED Triage Notes (Addendum)
Pt c/o urinary freq, dysuria x 3-4 days-states "I think I have a uti" -NAD-steady gait

## 2018-04-17 LAB — URINE CULTURE: Culture: NO GROWTH

## 2018-04-29 ENCOUNTER — Other Ambulatory Visit: Payer: Self-pay

## 2018-04-29 ENCOUNTER — Encounter (HOSPITAL_COMMUNITY): Payer: Self-pay | Admitting: *Deleted

## 2018-04-29 ENCOUNTER — Inpatient Hospital Stay (HOSPITAL_COMMUNITY)
Admission: AD | Admit: 2018-04-29 | Discharge: 2018-04-29 | Disposition: A | Discharge status: Home / Self Care | Payer: Medicaid Other | Source: Ambulatory Visit | Attending: Obstetrics & Gynecology | Admitting: Obstetrics & Gynecology

## 2018-04-29 DIAGNOSIS — K589 Irritable bowel syndrome without diarrhea: Secondary | ICD-10-CM | POA: Diagnosis not present

## 2018-04-29 DIAGNOSIS — F319 Bipolar disorder, unspecified: Secondary | ICD-10-CM | POA: Insufficient documentation

## 2018-04-29 DIAGNOSIS — Z882 Allergy status to sulfonamides status: Secondary | ICD-10-CM | POA: Diagnosis not present

## 2018-04-29 DIAGNOSIS — Z888 Allergy status to other drugs, medicaments and biological substances status: Secondary | ICD-10-CM | POA: Insufficient documentation

## 2018-04-29 DIAGNOSIS — L298 Other pruritus: Secondary | ICD-10-CM | POA: Diagnosis present

## 2018-04-29 DIAGNOSIS — B9689 Other specified bacterial agents as the cause of diseases classified elsewhere: Secondary | ICD-10-CM | POA: Diagnosis not present

## 2018-04-29 DIAGNOSIS — Z79899 Other long term (current) drug therapy: Secondary | ICD-10-CM | POA: Insufficient documentation

## 2018-04-29 DIAGNOSIS — B373 Candidiasis of vulva and vagina: Secondary | ICD-10-CM

## 2018-04-29 DIAGNOSIS — B3731 Acute candidiasis of vulva and vagina: Secondary | ICD-10-CM

## 2018-04-29 DIAGNOSIS — N760 Acute vaginitis: Secondary | ICD-10-CM | POA: Insufficient documentation

## 2018-04-29 DIAGNOSIS — R102 Pelvic and perineal pain: Secondary | ICD-10-CM

## 2018-04-29 LAB — CBC
HCT: 37.5 % (ref 36.0–46.0)
Hemoglobin: 12.9 g/dL (ref 12.0–15.0)
MCH: 33.2 pg (ref 26.0–34.0)
MCHC: 34.4 g/dL (ref 30.0–36.0)
MCV: 96.6 fL (ref 78.0–100.0)
Platelets: 206 10*3/uL (ref 150–400)
RBC: 3.88 MIL/uL (ref 3.87–5.11)
RDW: 12.2 % (ref 11.5–15.5)
WBC: 4.8 10*3/uL (ref 4.0–10.5)

## 2018-04-29 LAB — URINALYSIS, ROUTINE W REFLEX MICROSCOPIC
Bilirubin Urine: NEGATIVE
Glucose, UA: NEGATIVE mg/dL
Hgb urine dipstick: NEGATIVE
Ketones, ur: NEGATIVE mg/dL
LEUKOCYTES UA: NEGATIVE
NITRITE: NEGATIVE
PROTEIN: NEGATIVE mg/dL
SPECIFIC GRAVITY, URINE: 1.021 (ref 1.005–1.030)
pH: 5 (ref 5.0–8.0)

## 2018-04-29 LAB — POCT PREGNANCY, URINE: PREG TEST UR: NEGATIVE

## 2018-04-29 LAB — WET PREP, GENITAL
Sperm: NONE SEEN
TRICH WET PREP: NONE SEEN
YEAST WET PREP: NONE SEEN

## 2018-04-29 MED ORDER — METRONIDAZOLE 500 MG PO TABS: 500.0000 mg | ORAL_TABLET | Freq: Two times a day (BID) | ORAL | 0 refills | Status: DC

## 2018-04-29 MED ORDER — IBUPROFEN 600 MG PO TABS: 600.0000 mg | ORAL_TABLET | Freq: Four times a day (QID) | ORAL | 0 refills | Status: DC | PRN

## 2018-04-29 MED ORDER — KETOROLAC TROMETHAMINE 30 MG/ML IJ SOLN
60.0000 mg | Freq: Once | INTRAMUSCULAR | Status: AC
  Administered 2018-04-29: 60 mg via INTRAMUSCULAR
  Filled 2018-04-29 (×2): qty 2

## 2018-04-29 MED ORDER — FLUCONAZOLE 150 MG PO TABS: 150.0000 mg | ORAL_TABLET | Freq: Once | ORAL | 0 refills | Status: AC

## 2018-04-29 NOTE — MAU Provider Note (Addendum)
History     CSN: [id number]  Arrival date and time: 04/29/18 1021   None     Chief Complaint  Patient presents with  . Vaginal Itching  . Abdominal Pain  . Back Pain   Melanie Pineda is 32 yo F with Hx of PID and pelvic congestion syndrome presenting with a 3 month history of pelvic pain, vaginal burning and itching and low back pain. Hx of PID being treated with abx for past 2 weeks for PID but no relief of symptoms. Pt endorses thick white discharge for which she was supposed to receive diflucan for but said that the pharmacy said there was no prescription on file. No chance she is pregnant due to same sex relationship. No urinary symptoms such as burning, frequency or hematuria. Pt denies any recent STI.    Pertinent Gynecological History: Menses: Menstrual cycle 1 day late Bleeding: None Contraception: none Sexually transmitted diseases: past history: + for Trich and Chlamydia "many years ago" Last pap: normal Date: 03/28/17   Past Medical History:  Diagnosis Date  . Anemia   . Bipolar 1 disorder (HCC)   . IBS (irritable bowel syndrome)   . Reflux esophagitis     History reviewed. No pertinent surgical history.  History reviewed. No pertinent family history.  Social History   Tobacco Use  . Smoking status: Never Smoker  . Smokeless tobacco: Never Used  Substance Use Topics  . Alcohol use: No  . Drug use: Yes    Types: Marijuana    Allergies:  Allergies  Allergen Reactions  . Ultram [Tramadol] Nausea Only  . Bactrim [Sulfamethoxazole-Trimethoprim] Nausea Only    Medications Prior to Admission  Medication Sig Dispense Refill Last Dose  . busPIRone (BUSPAR) 10 MG tablet Take 10 mg by mouth 2 (two) times daily.     . Cariprazine HCl (VRAYLAR) 1.5 MG CAPS Take by mouth.   02/11/2017 at Unknown time  . clonazePAM (KLONOPIN) 1 MG tablet Take 1 mg by mouth 2 (two) times daily.   Past Week at Unknown time  . doxycycline (VIBRAMYCIN) 100 MG capsule Take 1 capsule  (100 mg total) by mouth 2 (two) times daily for 14 days. 28 capsule 0   . GABAPENTIN PO Take by mouth.     . methocarbamol (ROBAXIN) 500 MG tablet Take 1 tablet (500 mg total) by mouth 2 (two) times daily as needed (Muscle soreness). 20 tablet 0   . naproxen (NAPROSYN) 500 MG tablet Take 1 tablet (500 mg total) by mouth 2 (two) times daily as needed. 30 tablet 0   . omeprazole (PRILOSEC) 40 MG capsule Take 1 capsule (40 mg total) by mouth 2 (two) times daily. 28 capsule 0   . ondansetron (ZOFRAN ODT) 4 MG disintegrating tablet Take 1 tablet (4 mg total) by mouth every 8 (eight) hours as needed for nausea or vomiting. 20 tablet 0   . ondansetron (ZOFRAN ODT) 4 MG disintegrating tablet Take 1 tablet (4 mg total) by mouth every 8 (eight) hours as needed for nausea or vomiting. 15 tablet 0   . ranitidine (ZANTAC) 300 MG tablet Take 1 tablet (300 mg total) by mouth at bedtime. 30 tablet 2     Review of Systems  Constitutional: Negative for chills and fever.  Gastrointestinal: Positive for abdominal pain.  Genitourinary: Positive for pelvic pain and vaginal discharge. Negative for difficulty urinating, dysuria, flank pain, hematuria, menstrual problem, urgency and vaginal bleeding.       Thick white/yellow discharge noted  Musculoskeletal: Positive for back pain.       Lower back pain   Physical Exam   Blood pressure 113/69, pulse 73, temperature 98.3 F (36.8 C), temperature source Oral, resp. rate 18, height 5\' 6"  (1.676 m), weight 66.3 kg (146 lb 4 oz), last menstrual period 03/28/2018.  Physical Exam  Constitutional: She is oriented to person, place, and time. She appears well-developed and well-nourished. No distress.  HENT:  Head: Normocephalic and atraumatic.  Eyes: Conjunctivae are normal.  Cardiovascular: Normal rate, regular rhythm and normal heart sounds.  Respiratory: Effort normal and breath sounds normal.  GI: Soft. She exhibits no distension and no mass. There is tenderness.  There is no rebound and no guarding.  Genitourinary: Uterus normal. There is no rash on the right labia. There is no rash on the left labia. Cervix exhibits discharge. Cervix exhibits no motion tenderness and no friability. Right adnexum displays no mass and no tenderness. Left adnexum displays no mass and no tenderness. There is tenderness in the vagina. No bleeding in the vagina. Vaginal discharge found.  Genitourinary Comments: Thin white discharge noted  Musculoskeletal: Normal range of motion.       Lumbar back: She exhibits tenderness. She exhibits no bony tenderness.  TTP over paraspinal muscles  Neurological: She is alert and oriented to person, place, and time.  Skin: Skin is warm and dry.   Recent Results (from the past 2160 hour(s))  Urinalysis, Routine w reflex microscopic     Status: None   Collection Time: 04/29/18 10:40 AM  Result Value Ref Range   Color, Urine YELLOW YELLOW   APPearance CLEAR CLEAR   Specific Gravity, Urine 1.021 1.005 - 1.030   pH 5.0 5.0 - 8.0   Glucose, UA NEGATIVE NEGATIVE mg/dL   Hgb urine dipstick NEGATIVE NEGATIVE   Bilirubin Urine NEGATIVE NEGATIVE   Ketones, ur NEGATIVE NEGATIVE mg/dL   Protein, ur NEGATIVE NEGATIVE mg/dL   Nitrite NEGATIVE NEGATIVE   Leukocytes, UA NEGATIVE NEGATIVE    Comment: Performed at [facility], [address], [place], [place] [address]  Pregnancy, urine POC     Status: None   Collection Time: 04/29/18 10:56 AM  Result Value Ref Range   Preg Test, Ur NEGATIVE NEGATIVE    Comment:        THE SENSITIVITY OF THIS METHODOLOGY IS >24 mIU/mL   Wet prep, genital     Status: Abnormal   Collection Time: 04/29/18 11:30 AM  Result Value Ref Range   Yeast Wet Prep HPF POC NONE SEEN NONE SEEN   Trich, Wet Prep NONE SEEN NONE SEEN   Clue Cells Wet Prep HPF POC PRESENT (A) NONE SEEN   WBC, Wet Prep HPF POC FEW (A) NONE SEEN    Comment: MANY BACTERIA SEEN   Sperm NONE SEEN     Comment: Performed at [facility],  [address], [place], [place] [address]  CBC     Status: None   Collection Time: 04/29/18 11:33 AM  Result Value Ref Range   WBC 4.8 4.0 - 10.5 K/uL   RBC 3.88 3.87 - 5.11 MIL/uL   Hemoglobin 12.9 12.0 - 15.0 g/dL   HCT 29.5 62.1 - 30.8 %   MCV 96.6 78.0 - 100.0 fL   MCH 33.2 26.0 - 34.0 pg   MCHC 34.4 30.0 - 36.0 g/dL   RDW 65.7 84.6 - 96.2 %   Platelets 206 150 - 400 K/uL    Comment: Performed at [facility],  [address] [place], [place] [address]      MAU Course  Procedures  Wet prep, G/C, CBC, UA, and Pelvic exam   MDM  Diff Dx includes Infection vs PID vs Chronic Pelvic pain vs Tubo ovarian pathology  Given pts history of current abx use with white discharge, the burning and itching is most likely due to infection. UA is clean ruling out UTI as source of burning. No signs of pyelo either. Wet prep positive for BV and few WBC. G/C still pending. Recommend she stop current Abx treatment for PID given lack of resolution of symptoms. Rx Metronidazole and diflucan to treat BV and possible yeast infxn.  Lower abdominal pain could be d/t complications of previous PID or pelvic congestion syndrome but will require follow-up in an outpatient clinic. No signs of tubo-ovarian cyst rupture or torsion.  Administered IM Toradol for pain in MAU. Recommended pt follow-up in [place] gyn clinic.  Pt stable and can be discharged.  Assessment and Plan   1. BV 2. Pelvic Pain  -Stable for discharge -Rx Metronidazole and Diflucan -Follow-up in out clinic for further eval  Rolland Bimler, MS3 04/29/2018, 10:55 AM

## 2018-04-29 NOTE — Discharge Instructions (Signed)
Bacterial Vaginosis Bacterial vaginosis is an infection of the vagina. It happens when too many germs (bacteria) grow in the vagina. This infection puts you at risk for infections from sex (STIs). Treating this infection can lower your risk for some STIs. You should also treat this if you are pregnant. It can cause your baby to be born early. Follow these instructions at home: Medicines  Take over-the-counter and prescription medicines only as told by your doctor.  Take or use your antibiotic medicine as told by your doctor. Do not stop taking or using it even if you start to feel better. General instructions  If you your sexual partner is a woman, tell her that you have this infection. She needs to get treatment if she has symptoms. If you have a female partner, he does not need to be treated.  During treatment: ? Avoid sex. ? Do not douche. ? Avoid alcohol as told. ? Avoid breastfeeding as told.  Drink enough fluid to keep your pee (urine) clear or pale yellow.  Keep your vagina and butt (rectum) clean. ? Wash the area with warm water every day. ? Wipe from front to back after you use the toilet.  Keep all follow-up visits as told by your doctor. This is important. Preventing this condition  Do not douche.  Use only warm water to wash around your vagina.  Use protection when you have sex. This includes: ? Latex condoms. ? Dental dams.  Limit how many people you have sex with. It is best to only have sex with the same person (be monogamous).  Get tested for STIs. Have your partner get tested.  Wear underwear that is cotton or lined with cotton.  Avoid tight pants and pantyhose. This is most important in summer.  Do not use any products that have nicotine or tobacco in them. These include cigarettes and e-cigarettes. If you need help quitting, ask your doctor.  Do not use illegal drugs.  Limit how much alcohol you drink. Contact a doctor if:  Your symptoms do not get  better, even after you are treated.  You have more discharge or pain when you pee (urinate).  You have a fever.  You have pain in your belly (abdomen).  You have pain with sex.  Your bleed from your vagina between periods. Summary  This infection happens when too many germs (bacteria) grow in the vagina.  Treating this condition can lower your risk for some infections from sex (STIs).  You should also treat this if you are pregnant. It can cause early (premature) birth.  Do not stop taking or using your antibiotic medicine even if you start to feel better. This information is not intended to replace advice given to you by your health care provider. Make sure you discuss any questions you have with your health care provider. Document Released: 07/11/2008 Document Revised: 06/17/2016 Document Reviewed: 06/17/2016 Elsevier Interactive Patient Education  2017 Elsevier Inc. Vaginal Yeast infection, Adult Vaginal yeast infection is a condition that causes soreness, swelling, and redness (inflammation) of the vagina. It also causes vaginal discharge. This is a common condition. Some women get this infection frequently. What are the causes? This condition is caused by a change in the normal balance of the yeast (candida) and bacteria that live in the vagina. This change causes an overgrowth of yeast, which causes the inflammation. What increases the risk? This condition is more likely to develop in:  Women who take antibiotic medicines.  Women who have  diabetes.  Women who take birth control pills.  Women who are pregnant.  Women who douche often.  Women who have a weak defense (immune) system.  Women who have been taking steroid medicines for a long time.  Women who frequently wear tight clothing.  What are the signs or symptoms? Symptoms of this condition include:  White, thick vaginal discharge.  Swelling, itching, redness, and irritation of the vagina. The lips of the  vagina (vulva) Harkins be affected as well.  Pain or a burning feeling while urinating.  Pain during sex.  How is this diagnosed? This condition is diagnosed with a medical history and physical exam. This will include a pelvic exam. Your health care provider will examine a sample of your vaginal discharge under a microscope. Your health care provider Vandenbrink send this sample for testing to confirm the diagnosis. How is this treated? This condition is treated with medicine. Medicines Fontaine be over-the-counter or prescription. You Penninger be told to use one or more of the following:  Medicine that is taken orally.  Medicine that is applied as a cream.  Medicine that is inserted directly into the vagina (suppository).  Follow these instructions at home:  Take or apply over-the-counter and prescription medicines only as told by your health care provider.  Do not have sex until your health care provider has approved. Tell your sex partner that you have a yeast infection. That person should go to his or her health care provider if he or she develops symptoms.  Do not wear tight clothes, such as pantyhose or tight pants.  Avoid using tampons until your health care provider approves.  Eat more yogurt. This Digioia help to keep your yeast infection from returning.  Try taking a sitz bath to help with discomfort. This is a warm water bath that is taken while you are sitting down. The water should only come up to your hips and should cover your buttocks. Do this 3-4 times per day or as told by your health care provider.  Do not douche.  Wear breathable, cotton underwear.  If you have diabetes, keep your blood sugar levels under control. Contact a health care provider if:  You have a fever.  Your symptoms go away and then return.  Your symptoms do not get better with treatment.  Your symptoms get worse.  You have new symptoms.  You develop blisters in or around your vagina.  You have blood coming  from your vagina and it is not your menstrual period.  You develop pain in your abdomen. This information is not intended to replace advice given to you by your health care provider. Make sure you discuss any questions you have with your health care provider. Document Released: 07/12/2005 Document Revised: 03/15/2016 Document Reviewed: 04/05/2015 Elsevier Interactive Patient Education  2018 Elsevier Inc. Pelvic Pain, Female Pelvic pain is pain in your lower belly (abdomen), below your belly button and between your hips. The pain Legros start suddenly (acute), keep coming back (recurring), or last a long time (chronic). Pelvic pain that lasts longer than six months is considered chronic. There are many causes of pelvic pain. Sometimes the cause of your pelvic pain is not known. Follow these instructions at home:  Take over-the-counter and prescription medicines only as told by your doctor.  Rest as told by your doctor.  Do not have sex it if hurts.  Keep a journal of your pelvic pain. Write down: ? When the pain started. ? Where the pain  is located. ? What seems to make the pain better or worse, such as food or your menstrual cycle. ? Any symptoms you have along with the pain.  Keep all follow-up visits as told by your doctor. This is important. Contact a doctor if:  Medicine does not help your pain.  Your pain comes back.  You have new symptoms.  You have unusual vaginal discharge or bleeding.  You have a fever or chills.  You are having a hard time pooping (constipation).  You have blood in your pee (urine) or poop (stool).  Your pee smells bad.  You feel weak or lightheaded. Get help right away if:  You have sudden pain that is very bad.  Your pain continues to get worse.  You have very bad pain and also have any of the following symptoms: ? A fever. ? Feeling stick to your stomach (nausea). ? Throwing up (vomiting). ? Being very sweaty.  You pass out (lose  consciousness). This information is not intended to replace advice given to you by your health care provider. Make sure you discuss any questions you have with your health care provider. Document Released: 03/20/2008 Document Revised: 10/27/2015 Document Reviewed: 07/23/2015 Elsevier Interactive Patient Education  [employer].

## 2018-04-29 NOTE — MAU Provider Note (Signed)
History     CSN: [id number]  Arrival date and time: 04/29/18 1021   First Provider Initiated Contact with Patient 04/29/18 1100      Chief Complaint  Patient presents with  . Vaginal Itching  . Abdominal Pain  . Back Pain   32 y.o. non-pregnant female here with 3 month hx of pelvic pain, vaginal burning/itching, and low back pain. Was treated a few weeks ago for presumptive PID, cultures were negative. Reports a thick white discharge with odor. No new sexual partner, same sex partner. Denies urinary sx. She was told in the past she Antrobus have pelvic congestion syndrome. Having normal menses.   Past Medical History:  Diagnosis Date  . Anemia   . Bipolar 1 disorder (HCC)   . IBS (irritable bowel syndrome)   . Reflux esophagitis     History reviewed. No pertinent surgical history.  History reviewed. No pertinent family history.  Social History   Tobacco Use  . Smoking status: Never Smoker  . Smokeless tobacco: Never Used  Substance Use Topics  . Alcohol use: No  . Drug use: Yes    Types: Marijuana    Allergies:  Allergies  Allergen Reactions  . Ultram [Tramadol] Nausea Only  . Bactrim [Sulfamethoxazole-Trimethoprim] Nausea Only    Medications Prior to Admission  Medication Sig Dispense Refill Last Dose  . busPIRone (BUSPAR) 10 MG tablet Take 10 mg by mouth 2 (two) times daily.   04/28/2018 at am  . Cariprazine HCl (VRAYLAR) 1.5 MG CAPS Take 1.5 mg by mouth daily.    04/28/2018 at Unknown time  . clonazePAM (KLONOPIN) 1 MG tablet Take 1 mg by mouth 2 (two) times daily.   04/28/2018 at am  . doxycycline (VIBRAMYCIN) 100 MG capsule Take 1 capsule (100 mg total) by mouth 2 (two) times daily for 14 days. 28 capsule 0 04/28/2018 at am  . methocarbamol (ROBAXIN) 500 MG tablet Take 1 tablet (500 mg total) by mouth 2 (two) times daily as needed (Muscle soreness). (Patient not taking: Reported on 04/29/2018) 20 tablet 0 Not Taking at Unknown time  . naproxen (NAPROSYN) 500 MG  tablet Take 1 tablet (500 mg total) by mouth 2 (two) times daily as needed. (Patient not taking: Reported on 04/29/2018) 30 tablet 0 Not Taking at Unknown time  . omeprazole (PRILOSEC) 40 MG capsule Take 1 capsule (40 mg total) by mouth 2 (two) times daily. (Patient not taking: Reported on 04/29/2018) 28 capsule 0 Not Taking at Unknown time  . ondansetron (ZOFRAN ODT) 4 MG disintegrating tablet Take 1 tablet (4 mg total) by mouth every 8 (eight) hours as needed for nausea or vomiting. (Patient not taking: Reported on 04/29/2018) 20 tablet 0 Not Taking at Unknown time  . ondansetron (ZOFRAN ODT) 4 MG disintegrating tablet Take 1 tablet (4 mg total) by mouth every 8 (eight) hours as needed for nausea or vomiting. (Patient not taking: Reported on 04/29/2018) 15 tablet 0 Not Taking at Unknown time  . ranitidine (ZANTAC) 300 MG tablet Take 1 tablet (300 mg total) by mouth at bedtime. (Patient not taking: Reported on 04/29/2018) 30 tablet 2 Not Taking at Unknown time    Review of Systems  Constitutional: Negative for fever.  Gastrointestinal: Positive for abdominal pain.  Genitourinary: Positive for pelvic pain and vaginal discharge. Negative for dysuria, flank pain, frequency, hematuria and vaginal bleeding.  Musculoskeletal: Positive for back pain.   Physical Exam   Blood pressure 113/69, pulse 73, temperature 98.3 F (36.8 C), temperature  source Oral, resp. rate 18, height 5\' 6"  (1.676 m), weight 146 lb 4 oz (66.3 kg), last menstrual period 03/28/2018.  Physical Exam  Nursing note and vitals reviewed. Constitutional: She appears well-developed and well-nourished. No distress.  HENT:  Head: Normocephalic and atraumatic.  Neck: Normal range of motion.  Cardiovascular: Normal rate.  Respiratory: Effort normal. No respiratory distress.  GI: Soft. She exhibits no distension and no mass. There is tenderness in the right lower quadrant and left lower quadrant. There is no rebound, no guarding and no CVA  tenderness.  Genitourinary:  Genitourinary Comments: External: no lesions or erythema Vagina: rugated, pink, moist, small amt thin white discharge Uterus: non enlarged, anteverted, non tender, no CMT Adnexae: no masses, no tenderness left, no tenderness right Cervix nml   Musculoskeletal:       Cervical back: Normal.       Thoracic back: Normal.       Lumbar back: She exhibits tenderness.   Results for orders placed or performed during the hospital encounter of 04/29/18 (from the past 24 hour(s))  Urinalysis, Routine w reflex microscopic     Status: None   Collection Time: 04/29/18 10:40 AM  Result Value Ref Range   Color, Urine YELLOW YELLOW   APPearance CLEAR CLEAR   Specific Gravity, Urine 1.021 1.005 - 1.030   pH 5.0 5.0 - 8.0   Glucose, UA NEGATIVE NEGATIVE mg/dL   Hgb urine dipstick NEGATIVE NEGATIVE   Bilirubin Urine NEGATIVE NEGATIVE   Ketones, ur NEGATIVE NEGATIVE mg/dL   Protein, ur NEGATIVE NEGATIVE mg/dL   Nitrite NEGATIVE NEGATIVE   Leukocytes, UA NEGATIVE NEGATIVE  Pregnancy, urine POC     Status: None   Collection Time: 04/29/18 10:56 AM  Result Value Ref Range   Preg Test, Ur NEGATIVE NEGATIVE  Wet prep, genital     Status: Abnormal   Collection Time: 04/29/18 11:30 AM  Result Value Ref Range   Yeast Wet Prep HPF POC NONE SEEN NONE SEEN   Trich, Wet Prep NONE SEEN NONE SEEN   Clue Cells Wet Prep HPF POC PRESENT (A) NONE SEEN   WBC, Wet Prep HPF POC FEW (A) NONE SEEN   Sperm NONE SEEN   CBC     Status: None   Collection Time: 04/29/18 11:33 AM  Result Value Ref Range   WBC 4.8 4.0 - 10.5 K/uL   RBC 3.88 3.87 - 5.11 MIL/uL   Hemoglobin 12.9 12.0 - 15.0 g/dL   HCT 16.1 09.6 - 04.5 %   MCV 96.6 78.0 - 100.0 fL   MCH 33.2 26.0 - 34.0 pg   MCHC 34.4 30.0 - 36.0 g/dL   RDW 40.9 81.1 - 91.4 %   Platelets 206 150 - 400 K/uL   MAU Course  Procedures Toradol  MDM Labs ordered and reviewed. No evidence of pregnancy, UTI, or acute abdominal/pelvic process.  Will treat for presumptive yeast and BV. Pain is likely chronic in nature and we discussed outpt f/u. Stable for discharge home.   Assessment and Plan   1. Pelvic pain   2. Yeast vaginitis   3. Bacterial vaginosis    Discharge home Follow up in [facility]  Rx Flagyl Rx Diflucan Rx Ibuprofen  Allergies as of 04/29/2018      Reactions   Ultram [tramadol] Nausea Only   Bactrim [sulfamethoxazole-trimethoprim] Nausea Only      Medication List    STOP taking these medications   doxycycline 100 MG capsule Commonly known as:  VIBRAMYCIN   methocarbamol 500 MG tablet Commonly known as:  ROBAXIN   naproxen 500 MG tablet Commonly known as:  NAPROSYN   omeprazole 40 MG capsule Commonly known as:  PRILOSEC   ondansetron 4 MG disintegrating tablet Commonly known as:  ZOFRAN ODT   ranitidine 300 MG tablet Commonly known as:  ZANTAC     TAKE these medications   busPIRone 10 MG tablet Commonly known as:  BUSPAR Take 10 mg by mouth 2 (two) times daily.   clonazePAM 1 MG tablet Commonly known as:  KLONOPIN Take 1 mg by mouth 2 (two) times daily.   fluconazole 150 MG tablet Commonly known as:  DIFLUCAN Take 1 tablet (150 mg total) by mouth once for 1 dose. Matteo repeat dose on day 4   ibuprofen 600 MG tablet Commonly known as:  ADVIL,MOTRIN Take 1 tablet (600 mg total) by mouth every 6 (six) hours as needed.   metroNIDAZOLE 500 MG tablet Commonly known as:  FLAGYL Take 1 tablet (500 mg total) by mouth 2 (two) times daily.   VRAYLAR capsule Generic drug:  cariprazine Take 1.5 mg by mouth daily.       Donette LarryMelanie Fredia Chittenden, CNM 04/29/2018, 11:51 AM

## 2018-04-29 NOTE — MAU Note (Signed)
Pt presents with c/o lower abdominal and back pain that's ongoing for several months.  Also c/o vaginal itching, burning, & irritation.  Reports she is currently taking antibiotics for PID, been taking since since 04/16/18. LMP 03/28/2018

## 2018-04-30 LAB — GC/CHLAMYDIA PROBE AMP (~~LOC~~) NOT AT ARMC
Chlamydia: NEGATIVE
Neisseria Gonorrhea: NEGATIVE

## 2018-05-17 ENCOUNTER — Encounter (HOSPITAL_BASED_OUTPATIENT_CLINIC_OR_DEPARTMENT_OTHER): Payer: Self-pay | Admitting: *Deleted

## 2018-05-17 ENCOUNTER — Emergency Department (HOSPITAL_BASED_OUTPATIENT_CLINIC_OR_DEPARTMENT_OTHER): Payer: Self-pay

## 2018-05-17 ENCOUNTER — Emergency Department (HOSPITAL_BASED_OUTPATIENT_CLINIC_OR_DEPARTMENT_OTHER)
Admission: EM | Admit: 2018-05-17 | Discharge: 2018-05-17 | Disposition: A | Discharge status: Home / Self Care | Payer: Self-pay | Attending: Emergency Medicine | Admitting: Emergency Medicine

## 2018-05-17 ENCOUNTER — Other Ambulatory Visit: Payer: Self-pay

## 2018-05-17 DIAGNOSIS — Z79899 Other long term (current) drug therapy: Secondary | ICD-10-CM | POA: Insufficient documentation

## 2018-05-17 DIAGNOSIS — R1030 Lower abdominal pain, unspecified: Secondary | ICD-10-CM | POA: Insufficient documentation

## 2018-05-17 LAB — COMPREHENSIVE METABOLIC PANEL
ALT: 15 U/L (ref 0–44)
AST: 15 U/L (ref 15–41)
Albumin: 4 g/dL (ref 3.5–5.0)
Alkaline Phosphatase: 33 U/L — ABNORMAL LOW (ref 38–126)
Anion gap: 6 (ref 5–15)
BUN: 10 mg/dL (ref 6–20)
CO2: 30 mmol/L (ref 22–32)
Calcium: 8.9 mg/dL (ref 8.9–10.3)
Chloride: 102 mmol/L (ref 98–111)
Creatinine, Ser: 0.73 mg/dL (ref 0.44–1.00)
GFR calc Af Amer: >60 mL/min (ref >60)
GFR calc non Af Amer: >60 mL/min (ref >60)
Glucose, Bld: 88 mg/dL (ref 70–99)
Potassium: 3.8 mmol/L (ref 3.5–5.1)
Sodium: 138 mmol/L (ref 135–145)
Total Bilirubin: 0.3 mg/dL (ref 0.3–1.2)
Total Protein: 7.1 g/dL (ref 6.5–8.1)

## 2018-05-17 LAB — CBC WITH DIFFERENTIAL/PLATELET
Basophils Absolute: 0 10*3/uL (ref 0.0–0.1)
Basophils Relative: 0 %
Eosinophils Absolute: 0.2 10*3/uL (ref 0.0–0.7)
Eosinophils Relative: 3 %
HCT: 39.5 % (ref 36.0–46.0)
Hemoglobin: 13.8 g/dL (ref 12.0–15.0)
Lymphocytes Relative: 34 %
Lymphs Abs: 2 10*3/uL (ref 0.7–4.0)
MCH: 33.5 pg (ref 26.0–34.0)
MCHC: 34.9 g/dL (ref 30.0–36.0)
MCV: 95.9 fL (ref 78.0–100.0)
Monocytes Absolute: 0.7 10*3/uL (ref 0.1–1.0)
Monocytes Relative: 12 %
Neutro Abs: 2.9 10*3/uL (ref 1.7–7.7)
Neutrophils Relative %: 51 %
Platelets: 198 10*3/uL (ref 150–400)
RBC: 4.12 MIL/uL (ref 3.87–5.11)
RDW: 11.4 % — ABNORMAL LOW (ref 11.5–15.5)
WBC: 5.7 10*3/uL (ref 4.0–10.5)

## 2018-05-17 LAB — WET PREP, GENITAL
Sperm: NONE SEEN
Trich, Wet Prep: NONE SEEN
Yeast Wet Prep HPF POC: NONE SEEN

## 2018-05-17 LAB — URINALYSIS, ROUTINE W REFLEX MICROSCOPIC
Bilirubin Urine: NEGATIVE
GLUCOSE, UA: NEGATIVE mg/dL
HGB URINE DIPSTICK: NEGATIVE
Ketones, ur: NEGATIVE mg/dL
Leukocytes, UA: NEGATIVE
Nitrite: NEGATIVE
PH: 7.5 (ref 5.0–8.0)
Protein, ur: NEGATIVE mg/dL
SPECIFIC GRAVITY, URINE: 1.01 (ref 1.005–1.030)

## 2018-05-17 LAB — LIPASE, BLOOD: Lipase: 33 U/L (ref 11–51)

## 2018-05-17 LAB — PREGNANCY, URINE: Preg Test, Ur: NEGATIVE

## 2018-05-17 MED ORDER — IOPAMIDOL (ISOVUE-300) INJECTION 61%
100.0000 mL | Freq: Once | INTRAVENOUS | Status: AC | PRN
  Administered 2018-05-17: 100 mL via INTRAVENOUS

## 2018-05-17 MED ORDER — DICYCLOMINE HCL 20 MG PO TABS: 20.0000 mg | ORAL_TABLET | Freq: Two times a day (BID) | ORAL | 0 refills | Status: DC

## 2018-05-17 NOTE — Discharge Instructions (Signed)
Begin taking Bentyl to help with the abdominal cramping.  Please follow-up with OB/GYN for further evaluation and treatment of your symptoms.   I also recommend establishing care with a primary care provider.  Please return the emergency department if you develop any new or worsening symptoms.

## 2018-05-17 NOTE — ED Triage Notes (Signed)
Pt c/o lower abd pain and pressure x 3 months

## 2018-05-17 NOTE — ED Provider Notes (Signed)
[facility] EMERGENCY DEPARTMENT Provider Note   CSN: [id number] Arrival date & time: 05/17/18  1436     History   Chief Complaint Chief Complaint  Patient presents with  . Abdominal Pain    HPI Melanie Pineda is a 32 y.o. female with history of bipolar 1 disorder, IBS who presents with a 8052-month history of intermittent lower abdominal pain and pressure.  She describes the pain as cramping.  It is bilateral lower abdominal pain, however worse on the left side.  She reports some radiation pain to her back.  She describes it as feeling like her menstrual cycle is about to start, however it is oftentimes unrelated to her menstrual cycle.  She oftentimes has some nausea related.  She reports severe pain with urination in her lower abdomen and occasionally bowel movements.  She reports in the past she was told she [place] have pelvic congestion syndrome.  She is not currently have an OB/GYN.  She has no abnormal vaginal bleeding or discharge.  She had a normal menstrual cycle on 04/30/2018. No current concern for STD exposure.  HPI  Past Medical History:  Diagnosis Date  . Anemia   . Bipolar 1 disorder (HCC)   . IBS (irritable bowel syndrome)   . Reflux esophagitis     There are no active problems to display for this patient.   History reviewed. No pertinent surgical history.   OB History    Gravida  2   Para  2   Term  2   Preterm      AB      Living  2     SAB      TAB      Ectopic      Multiple      Live Births  1            Home Medications    Prior to Admission medications   Medication Sig Start Date End Date Taking? Authorizing Provider  busPIRone (BUSPAR) 10 MG tablet Take 10 mg by mouth 2 (two) times daily.    [provider]  Cariprazine HCl (VRAYLAR) 1.5 MG CAPS Take 1.5 mg by mouth daily.     [provider]  clonazePAM (KLONOPIN) 1 MG tablet Take 1 mg by mouth 2 (two) times daily.    [provider]  dicyclomine  (BENTYL) 20 MG tablet Take 1 tablet (20 mg total) by mouth 2 (two) times daily. 05/17/18   Ebrahim Deremer, Waylan BogaAlexandra M, PA-C  ibuprofen (ADVIL,MOTRIN) 600 MG tablet Take 1 tablet (600 mg total) by mouth every 6 (six) hours as needed. 04/29/18   Donette LarryBhambri, Melanie, CNM  metroNIDAZOLE (FLAGYL) 500 MG tablet Take 1 tablet (500 mg total) by mouth 2 (two) times daily. 04/29/18   Donette LarryBhambri, Melanie, CNM    Family History History reviewed. No pertinent family history.  Social History Social History   Tobacco Use  . Smoking status: Never Smoker  . Smokeless tobacco: Never Used  Substance Use Topics  . Alcohol use: No  . Drug use: Yes    Types: Marijuana     Allergies   Ultram [tramadol] and Bactrim [sulfamethoxazole-trimethoprim]   Review of Systems Review of Systems  Constitutional: Negative for chills and fever.  HENT: Negative for facial swelling and sore throat.   Respiratory: Negative for shortness of breath.   Cardiovascular: Negative for chest pain.  Gastrointestinal: Positive for abdominal pain and nausea. Negative for diarrhea and vomiting.  Genitourinary: Positive for dysuria.  Musculoskeletal: Negative for back pain.  Skin: Negative for rash and wound.  Neurological: Negative for headaches.  Psychiatric/Behavioral: The patient is not nervous/anxious.      Physical Exam Updated Vital Signs BP 115/88 (BP Location: Right Arm)   Pulse 65   Temp 98.3 F (36.8 C) (Oral)   Resp 18   Ht 5\' 6"  (1.676 m)   Wt 66.2 kg (146 lb)   LMP 04/30/2018   SpO2 100%   BMI 23.57 kg/m   Physical Exam  Constitutional: She appears well-developed and well-nourished. No distress.  HENT:  Head: Normocephalic and atraumatic.  Mouth/Throat: Oropharynx is clear and moist. No oropharyngeal exudate.  Eyes: Pupils are equal, round, and reactive to light. Conjunctivae are normal. Right eye exhibits no discharge. Left eye exhibits no discharge. No scleral icterus.  Neck: Normal range of motion. Neck supple.  No thyromegaly present.  Cardiovascular: Normal rate, regular rhythm, normal heart sounds and intact distal pulses. Exam reveals no gallop and no friction rub.  No murmur heard. Pulmonary/Chest: Effort normal and breath sounds normal. No stridor. No respiratory distress. She has no wheezes. She has no rales.  Abdominal: Soft. Bowel sounds are normal. She exhibits no distension. There is tenderness in the suprapubic area and left lower quadrant. There is no rebound, no guarding, no CVA tenderness and no tenderness at McBurney's point.  Genitourinary: Uterus is tender. Cervix exhibits motion tenderness. Right adnexum displays no tenderness. Left adnexum displays tenderness. Vaginal discharge (white, could be physiologic) found.  Musculoskeletal: She exhibits no edema.  Lymphadenopathy:    She has no cervical adenopathy.  Neurological: She is alert. Coordination normal.  Skin: Skin is warm and dry. No rash noted. She is not diaphoretic. No pallor.  Psychiatric: She has a normal mood and affect.  Nursing note and vitals reviewed.    ED Treatments / Results  Labs (all labs ordered are listed, but only abnormal results are displayed) Labs Reviewed  WET PREP, GENITAL - Abnormal; Notable for the following components:      Result Value   Clue Cells Wet Prep HPF POC PRESENT (*)    WBC, Wet Prep HPF POC FEW (*)    All other components within normal limits  URINALYSIS, ROUTINE W REFLEX MICROSCOPIC - Abnormal; Notable for the following components:   APPearance HAZY (*)    All other components within normal limits  COMPREHENSIVE METABOLIC PANEL - Abnormal; Notable for the following components:   Alkaline Phosphatase 33 (*)    All other components within normal limits  CBC WITH DIFFERENTIAL/PLATELET - Abnormal; Notable for the following components:   RDW 11.4 (*)    All other components within normal limits  PREGNANCY, URINE  LIPASE, BLOOD  GC/CHLAMYDIA PROBE AMP ([place]) NOT AT [place]     EKG None  Radiology US Transvaginal Non-ob  Result Date: 05/17/2018 CLINICAL DATA:  Left lower quadrant abdomen and pelvic pain for the past 3 months, worse today. Previously treated for sexually transmitted disease, yeast and bacterial infections. EXAM: TRANSABDOMINAL AND TRANSVAGINAL ULTRASOUND OF PELVIS DOPPLER ULTRASOUND OF OVARIES TECHNIQUE: Both transabdominal and transvaginal ultrasound examinations of the pelvis were performed. Transabdominal technique was performed for global imaging of the pelvis including uterus, ovaries, adnexal regions, and pelvic cul-de-sac. It was necessary to proceed with endovaginal exam following the transabdominal exam to visualize the uterus, ovaries and endometrium in better detail. Color and duplex Doppler ultrasound was utilized to evaluate blood flow to the ovaries. COMPARISON:  None. FINDINGS: Uterus Measurements: 10.2  x 7.3 x 5.1 cm. No fibroids or other mass visualized. Endometrium Thickness: 4.4 mm.  No focal abnormality visualized. Right ovary Measurements: 3.1 x 2.3 x 2.3 cm. Normal appearance/no adnexal mass. Left ovary Measurements: 3.9 x 2.6 x 2.2 cm. Normal appearance/no adnexal mass. Pulsed Doppler evaluation of both ovaries demonstrates normal low-resistance arterial and venous waveforms. Other findings No abnormal free fluid. IMPRESSION: Normal examination. Electronically Signed   By: Beckie Salts M.D.   On: 05/17/2018 17:09   US Pelvis Complete  Result Date: 05/17/2018 CLINICAL DATA:  Left lower quadrant abdomen and pelvic pain for the past 3 months, worse today. Previously treated for sexually transmitted disease, yeast and bacterial infections. EXAM: TRANSABDOMINAL AND TRANSVAGINAL ULTRASOUND OF PELVIS DOPPLER ULTRASOUND OF OVARIES TECHNIQUE: Both transabdominal and transvaginal ultrasound examinations of the pelvis were performed. Transabdominal technique was performed for global imaging of the pelvis including uterus, ovaries, adnexal regions,  and pelvic cul-de-sac. It was necessary to proceed with endovaginal exam following the transabdominal exam to visualize the uterus, ovaries and endometrium in better detail. Color and duplex Doppler ultrasound was utilized to evaluate blood flow to the ovaries. COMPARISON:  None. FINDINGS: Uterus Measurements: 10.2 x 7.3 x 5.1 cm. No fibroids or other mass visualized. Endometrium Thickness: 4.4 mm.  No focal abnormality visualized. Right ovary Measurements: 3.1 x 2.3 x 2.3 cm. Normal appearance/no adnexal mass. Left ovary Measurements: 3.9 x 2.6 x 2.2 cm. Normal appearance/no adnexal mass. Pulsed Doppler evaluation of both ovaries demonstrates normal low-resistance arterial and venous waveforms. Other findings No abnormal free fluid. IMPRESSION: Normal examination. Electronically Signed   By: Beckie Salts M.D.   On: 05/17/2018 17:09   Ct Abdomen Pelvis W Contrast  Result Date: 05/17/2018 CLINICAL DATA:  Abdominal pain EXAM: CT ABDOMEN AND PELVIS WITH CONTRAST TECHNIQUE: Multidetector CT imaging of the abdomen and pelvis was performed using the standard protocol following bolus administration of intravenous contrast. CONTRAST:  ISOVUE-300 IOPAMIDOL (ISOVUE-300) INJECTION 61% COMPARISON:  Ultrasound 05/14/2017, 05/17/2018 FINDINGS: Lower chest: No acute abnormality. Hepatobiliary: No focal liver abnormality is seen. No gallstones, gallbladder wall thickening, or biliary dilatation. Pancreas: Unremarkable. No pancreatic ductal dilatation or surrounding inflammatory changes. Spleen: Normal in size without focal abnormality. Adrenals/Urinary Tract: Adrenal glands are unremarkable. Kidneys are normal, without renal calculi, focal lesion, or hydronephrosis. Bladder is unremarkable. Stomach/Bowel: Stomach is within normal limits. Appendix appears normal. No evidence of bowel wall thickening, distention, or inflammatory changes. Vascular/Lymphatic: No significant vascular findings are present. No enlarged abdominal  or pelvic lymph nodes. Reproductive: Uterus and bilateral adnexa are unremarkable. Other: No abdominal wall hernia or abnormality. No abdominopelvic ascites. Musculoskeletal: No acute or significant osseous findings. IMPRESSION: Negative. No CT evidence for acute intra-abdominal or pelvic abnormality. Electronically Signed   By: Jasmine Pang M.D.   On: 05/17/2018 19:09   [place] Art/ven Flow Abd Pelv Doppler  Result Date: 05/17/2018 CLINICAL DATA:  Left lower quadrant abdomen and pelvic pain for the past 3 months, worse today. Previously treated for sexually transmitted disease, yeast and bacterial infections. EXAM: TRANSABDOMINAL AND TRANSVAGINAL ULTRASOUND OF PELVIS DOPPLER ULTRASOUND OF OVARIES TECHNIQUE: Both transabdominal and transvaginal ultrasound examinations of the pelvis were performed. Transabdominal technique was performed for global imaging of the pelvis including uterus, ovaries, adnexal regions, and pelvic cul-de-sac. It was necessary to proceed with endovaginal exam following the transabdominal exam to visualize the uterus, ovaries and endometrium in better detail. Color and duplex Doppler ultrasound was utilized to evaluate blood flow to the ovaries. COMPARISON:  None. FINDINGS: Uterus Measurements: 10.2 x 7.3 x 5.1 cm. No fibroids or other mass visualized. Endometrium Thickness: 4.4 mm.  No focal abnormality visualized. Right ovary Measurements: 3.1 x 2.3 x 2.3 cm. Normal appearance/no adnexal mass. Left ovary Measurements: 3.9 x 2.6 x 2.2 cm. Normal appearance/no adnexal mass. Pulsed Doppler evaluation of both ovaries demonstrates normal low-resistance arterial and venous waveforms. Other findings No abnormal free fluid. IMPRESSION: Normal examination. Electronically Signed   By: Beckie Salts M.D.   On: 05/17/2018 17:09    Procedures Procedures (including critical care time)  Medications Ordered in ED Medications  iopamidol (ISOVUE-300) 61 % injection 100 mL (100 mLs Intravenous Contrast  Given 05/17/18 1830)     Initial Impression / Assessment and Plan / ED Course  I have reviewed the triage vital signs and the nursing notes.  Pertinent labs & imaging results that were available during my care of the patient were reviewed by me and considered in my medical decision making (see chart for details).  Clinical Course as of May 18 14  Fri May 17, 2018  1747 Pelvic ultrasound is negative.  Labs are unremarkable.  On repeat abdominal exam, left lower quadrant pain is improved, however patient with significant right lower quadrant tenderness and suprapubic tenderness.  Will proceed with CT abdomen pelvis.   [AL]    Clinical Course User Index [AL] Emi Holes, PA-C    Patient with lower abdominal pain.  It has been intermittent for 3 months.  Labs are unremarkable. with normal pelvic ultrasound and CT abdomen pelvis.  Patient is well-appearing and declined pain medication in the ED, although significantly tender on abdominal exam indicating imaging.  Patient will be referred to OB/GYN and GI for further evaluation.  Will discharge home with Bentyl.  Return precautions discussed.  Patient understands and agrees with plan.  Patient vitals stable throughout ED course and discharged in satisfactory condition.  Final Clinical Impressions(s) / ED Diagnoses   Final diagnoses:  Lower abdominal pain    ED Discharge Orders        Ordered    dicyclomine (BENTYL) 20 MG tablet  2 times daily     05/17/18 [address], PA-C 05/18/18 0015    Mesner, Barbara Cower, MD 05/18/18 [phone]

## 2018-05-20 LAB — GC/CHLAMYDIA PROBE AMP (~~LOC~~) NOT AT ARMC
Chlamydia: NEGATIVE
Neisseria Gonorrhea: NEGATIVE

## 2018-06-10 ENCOUNTER — Other Ambulatory Visit: Payer: Self-pay

## 2018-06-10 ENCOUNTER — Emergency Department (HOSPITAL_BASED_OUTPATIENT_CLINIC_OR_DEPARTMENT_OTHER)
Admission: EM | Admit: 2018-06-10 | Discharge: 2018-06-10 | Disposition: A | Discharge status: Home / Self Care | Payer: Medicaid Other | Attending: Emergency Medicine | Admitting: Emergency Medicine

## 2018-06-10 ENCOUNTER — Encounter (HOSPITAL_BASED_OUTPATIENT_CLINIC_OR_DEPARTMENT_OTHER): Payer: Self-pay | Admitting: *Deleted

## 2018-06-10 DIAGNOSIS — Z87891 Personal history of nicotine dependence: Secondary | ICD-10-CM | POA: Insufficient documentation

## 2018-06-10 DIAGNOSIS — Z79899 Other long term (current) drug therapy: Secondary | ICD-10-CM | POA: Insufficient documentation

## 2018-06-10 DIAGNOSIS — M62830 Muscle spasm of back: Secondary | ICD-10-CM

## 2018-06-10 MED ORDER — MELOXICAM 15 MG PO TABS: 15.0000 mg | ORAL_TABLET | Freq: Every day | ORAL | 0 refills | Status: DC

## 2018-06-10 MED ORDER — CYCLOBENZAPRINE HCL 10 MG PO TABS: 10.0000 mg | ORAL_TABLET | Freq: Two times a day (BID) | ORAL | 0 refills | Status: DC | PRN
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
## 2018-06-10 NOTE — Discharge Instructions (Addendum)
Continue lidocaine patches.  Try stretching and rolling out your back.  Take Mobic as prescribed for pain and inflammation.  Take Flexeril for muscle spasms.  Avoid lifting anything heavy for at least a week.  Follow-up with your family doctor.

## 2018-06-10 NOTE — ED Triage Notes (Addendum)
Pt reports upper mid back pain x Saturday, denies any injury or trauma, amb to triage with quick steady gait in nad.

## 2018-07-03 ENCOUNTER — Encounter (HOSPITAL_BASED_OUTPATIENT_CLINIC_OR_DEPARTMENT_OTHER): Payer: Self-pay | Admitting: Emergency Medicine

## 2018-07-03 ENCOUNTER — Other Ambulatory Visit: Payer: Self-pay

## 2018-07-03 ENCOUNTER — Emergency Department (HOSPITAL_BASED_OUTPATIENT_CLINIC_OR_DEPARTMENT_OTHER)
Admission: EM | Admit: 2018-07-03 | Discharge: 2018-07-03 | Disposition: A | Discharge status: Home / Self Care | Payer: Medicaid Other | Attending: Emergency Medicine | Admitting: Emergency Medicine

## 2018-07-03 DIAGNOSIS — Y999 Unspecified external cause status: Secondary | ICD-10-CM | POA: Insufficient documentation

## 2018-07-03 DIAGNOSIS — Z87891 Personal history of nicotine dependence: Secondary | ICD-10-CM | POA: Insufficient documentation

## 2018-07-03 DIAGNOSIS — S29012A Strain of muscle and tendon of back wall of thorax, initial encounter: Secondary | ICD-10-CM | POA: Diagnosis not present

## 2018-07-03 DIAGNOSIS — Z79899 Other long term (current) drug therapy: Secondary | ICD-10-CM | POA: Insufficient documentation

## 2018-07-03 DIAGNOSIS — Y92810 Car as the place of occurrence of the external cause: Secondary | ICD-10-CM | POA: Insufficient documentation

## 2018-07-03 DIAGNOSIS — S299XXA Unspecified injury of thorax, initial encounter: Secondary | ICD-10-CM | POA: Diagnosis present

## 2018-07-03 DIAGNOSIS — X501XXA Overexertion from prolonged static or awkward postures, initial encounter: Secondary | ICD-10-CM | POA: Insufficient documentation

## 2018-07-03 DIAGNOSIS — Y939 Activity, unspecified: Secondary | ICD-10-CM | POA: Insufficient documentation

## 2018-07-03 DIAGNOSIS — S239XXA Sprain of unspecified parts of thorax, initial encounter: Secondary | ICD-10-CM

## 2018-07-03 MED ORDER — ACETAMINOPHEN 500 MG PO TABS
1000.0000 mg | ORAL_TABLET | Freq: Once | ORAL | Status: AC
  Administered 2018-07-03: 1000 mg via ORAL
  Filled 2018-07-03: qty 2

## 2018-07-03 MED ORDER — OXYCODONE HCL 5 MG PO TABS
5.0000 mg | ORAL_TABLET | Freq: Once | ORAL | Status: AC
  Administered 2018-07-03: 5 mg via ORAL
  Filled 2018-07-03: qty 1

## 2018-07-03 MED ORDER — KETOROLAC TROMETHAMINE 15 MG/ML IJ SOLN
15.0000 mg | Freq: Once | INTRAMUSCULAR | Status: AC
  Administered 2018-07-03: 15 mg via INTRAMUSCULAR
  Filled 2018-07-03: qty 1

## 2018-07-03 MED ORDER — DIAZEPAM 2 MG PO TABS
2.0000 mg | ORAL_TABLET | Freq: Once | ORAL | Status: AC
  Administered 2018-07-03: 2 mg via ORAL
  Filled 2018-07-03: qty 1
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
## 2018-07-03 NOTE — ED Notes (Signed)
Pt/family verbalized understanding of discharge instructions.   

## 2018-07-03 NOTE — ED Provider Notes (Signed)
[facility] EMERGENCY DEPARTMENT Provider Note   CSN: [id number] Arrival date & time: 07/03/18  4098     History   Chief Complaint Chief Complaint  Patient presents with  . Back Pain    HPI Melanie Pineda is a 32 y.o. female.  58 yoF with a chief complaint of upper back pain.  This happened when she was trying to get out of the car she noted a sharp and severe pain to that area.  Worse with movement twisting and palpation.  She denies cough congestion or fever.  Denies weakness or numbness to the upper extremities.  The history is provided by the patient.  Back Pain   This is a new problem. The current episode started yesterday. The problem has been gradually worsening. Associated with: Getting out of the car. The pain is present in the thoracic spine. The quality of the pain is described as stabbing and shooting. The pain does not radiate. The pain is at a severity of 5/10. The pain is moderate. The symptoms are aggravated by bending and twisting. Pertinent negatives include no chest pain, no fever, no headaches, no abdominal pain and no dysuria. She has tried nothing for the symptoms. The treatment provided no relief.    Past Medical History:  Diagnosis Date  . Anemia   . Bipolar 1 disorder (HCC)   . IBS (irritable bowel syndrome)   . Reflux esophagitis     There are no active problems to display for this patient.   History reviewed. No pertinent surgical history.   OB History    Gravida  2   Para  2   Term  2   Preterm      AB      Living  2     SAB      TAB      Ectopic      Multiple      Live Births  1            Home Medications    Prior to Admission medications   Medication Sig Start Date End Date Taking? Authorizing Provider  busPIRone (BUSPAR) 10 MG tablet Take 10 mg by mouth 2 (two) times daily.    [provider]  Cariprazine HCl (VRAYLAR) 1.5 MG CAPS Take 1.5 mg by mouth daily.     [provider]    clonazePAM (KLONOPIN) 1 MG tablet Take 1 mg by mouth 2 (two) times daily.    [provider]  cyclobenzaprine (FLEXERIL) 10 MG tablet Take 1 tablet (10 mg total) by mouth 2 (two) times daily as needed for muscle spasms. 06/10/18   Kirichenko, Tatyana, PA-C  dicyclomine (BENTYL) 20 MG tablet Take 1 tablet (20 mg total) by mouth 2 (two) times daily. 05/17/18   Law, Waylan Boga, PA-C  ibuprofen (ADVIL,MOTRIN) 600 MG tablet Take 1 tablet (600 mg total) by mouth every 6 (six) hours as needed. 04/29/18   Donette Larry, CNM  meloxicam (MOBIC) 15 MG tablet Take 1 tablet (15 mg total) by mouth daily. 06/10/18   Kirichenko, Tatyana, PA-C  metroNIDAZOLE (FLAGYL) 500 MG tablet Take 1 tablet (500 mg total) by mouth 2 (two) times daily. 04/29/18   Donette Larry, CNM    Family History No family history on file.  Social History Social History   Tobacco Use  . Smoking status: Former Games developer  . Smokeless tobacco: Never Used  Substance Use Topics  . Alcohol use: No  . Drug use: Yes  Types: Marijuana     Allergies   Ultram [tramadol] and Bactrim [sulfamethoxazole-trimethoprim]   Review of Systems Review of Systems  Constitutional: Negative for chills and fever.  HENT: Negative for congestion and rhinorrhea.   Eyes: Negative for redness and visual disturbance.  Respiratory: Negative for shortness of breath and wheezing.   Cardiovascular: Negative for chest pain and palpitations.  Gastrointestinal: Negative for abdominal pain, nausea and vomiting.  Genitourinary: Negative for dysuria and urgency.  Musculoskeletal: Positive for arthralgias, back pain and myalgias.  Skin: Negative for pallor and wound.  Neurological: Negative for dizziness and headaches.     Physical Exam Updated Vital Signs BP 121/72 (BP Location: Right Arm)   Pulse 65   Temp 98.2 F (36.8 C) (Oral)   Resp 18   LMP 06/02/2018   SpO2 100%   Physical Exam  Constitutional: She is oriented to person, place, and  time. She appears well-developed and well-nourished. No distress.  HENT:  Head: Normocephalic and atraumatic.  Eyes: Pupils are equal, round, and reactive to light. EOM are normal.  Neck: Normal range of motion. Neck supple.  Cardiovascular: Normal rate and regular rhythm. Exam reveals no gallop and no friction rub.  No murmur heard. Pulmonary/Chest: Effort normal. She has no wheezes. She has no rales.  Abdominal: Soft. She exhibits no distension. There is no tenderness.  Musculoskeletal: She exhibits tenderness. She exhibits no edema.  Focally tender to palpation between the scapula and the thoracic vertebrae on the left side.  Reproduces the patient's pain.  No midline spinal tenderness.  Pulse motor and sensation intact to bilateral upper extremities.  Neurological: She is alert and oriented to person, place, and time.  Skin: Skin is warm and dry. She is not diaphoretic.  Psychiatric: She has a normal mood and affect. Her behavior is normal.  Nursing note and vitals reviewed.    ED Treatments / Results  Labs (all labs ordered are listed, but only abnormal results are displayed) Labs Reviewed - No data to display  EKG None  Radiology No results found.  Procedures Procedures (including critical care time)  Medications Ordered in ED Medications  acetaminophen (TYLENOL) tablet 1,000 mg (1,000 mg Oral Given 07/03/18 1050)  ketorolac (TORADOL) 15 MG/ML injection 15 mg (15 mg Intramuscular Given 07/03/18 1051)  oxyCODONE (Oxy IR/ROXICODONE) immediate release tablet 5 mg (5 mg Oral Given 07/03/18 1050)  diazepam (VALIUM) tablet 2 mg (2 mg Oral Given 07/03/18 1050)     Initial Impression / Assessment and Plan / ED Course  I have reviewed the triage vital signs and the nursing notes.  Pertinent labs & imaging results that were available during my care of the patient were reviewed by me and considered in my medical decision making (see chart for details).     32 yo F with a chief  complaint of left upper back pain.  She felt something twinge when she was getting out of the car yesterday.  Has persisted today.  No trauma.  Clear lung sounds.  No concerning findings on exam.  Will treat as muscular skeletal.  PCP follow-up.  11:08 AM:  I have discussed the diagnosis/risks/treatment options with the patient and family and believe the pt to be eligible for discharge home to follow-up with PCP. We also discussed returning to the ED immediately if new or worsening sx occur. We discussed the sx which are most concerning (e.g., sudden worsening pain, fever, inability to tolerate by mouth) that necessitate immediate return. Medications administered to  the patient during their visit and any new prescriptions provided to the patient are listed below.  Medications given during this visit Medications  acetaminophen (TYLENOL) tablet 1,000 mg (1,000 mg Oral Given 07/03/18 1050)  ketorolac (TORADOL) 15 MG/ML injection 15 mg (15 mg Intramuscular Given 07/03/18 1051)  oxyCODONE (Oxy IR/ROXICODONE) immediate release tablet 5 mg (5 mg Oral Given 07/03/18 1050)  diazepam (VALIUM) tablet 2 mg (2 mg Oral Given 07/03/18 1050)     The patient appears reasonably screen and/or stabilized for discharge and I doubt any other medical condition or other [facility] requiring further screening, evaluation, or treatment in the ED at this time prior to discharge.    Final Clinical Impressions(s) / ED Diagnoses   Final diagnoses:  Thoracic back sprain, initial encounter    ED Discharge Orders    None       Melene PlanFloyd, Latrell Potempa, DO 07/03/18 1108

## 2018-07-03 NOTE — Discharge Instructions (Signed)
Take 4 over the counter ibuprofen tablets 3 times a day or 2 over-the-counter naproxen tablets twice a day for pain. Also take tylenol 1000mg(2 extra strength) four times a day.    

## 2018-07-15 ENCOUNTER — Emergency Department (HOSPITAL_BASED_OUTPATIENT_CLINIC_OR_DEPARTMENT_OTHER)
Admission: EM | Admit: 2018-07-15 | Discharge: 2018-07-15 | Disposition: A | Discharge status: Home / Self Care | Payer: Medicaid Other | Attending: Emergency Medicine | Admitting: Emergency Medicine

## 2018-07-15 ENCOUNTER — Encounter (HOSPITAL_BASED_OUTPATIENT_CLINIC_OR_DEPARTMENT_OTHER): Payer: Self-pay

## 2018-07-15 ENCOUNTER — Emergency Department (HOSPITAL_BASED_OUTPATIENT_CLINIC_OR_DEPARTMENT_OTHER): Payer: Medicaid Other

## 2018-07-15 DIAGNOSIS — O9989 Other specified diseases and conditions complicating pregnancy, childbirth and the puerperium: Secondary | ICD-10-CM | POA: Diagnosis not present

## 2018-07-15 DIAGNOSIS — A599 Trichomoniasis, unspecified: Secondary | ICD-10-CM

## 2018-07-15 DIAGNOSIS — Z87891 Personal history of nicotine dependence: Secondary | ICD-10-CM | POA: Insufficient documentation

## 2018-07-15 DIAGNOSIS — R079 Chest pain, unspecified: Secondary | ICD-10-CM

## 2018-07-15 DIAGNOSIS — Z3A01 Less than 8 weeks gestation of pregnancy: Secondary | ICD-10-CM | POA: Diagnosis not present

## 2018-07-15 DIAGNOSIS — R112 Nausea with vomiting, unspecified: Secondary | ICD-10-CM

## 2018-07-15 DIAGNOSIS — O26899 Other specified pregnancy related conditions, unspecified trimester: Secondary | ICD-10-CM | POA: Diagnosis not present

## 2018-07-15 LAB — CBC
HEMATOCRIT: 39.6 % (ref 36.0–46.0)
Hemoglobin: 14 g/dL (ref 12.0–15.0)
MCH: 32.8 pg (ref 26.0–34.0)
MCHC: 35.4 g/dL (ref 30.0–36.0)
MCV: 92.7 fL (ref 78.0–100.0)
PLATELETS: 213 10*3/uL (ref 150–400)
RBC: 4.27 MIL/uL (ref 3.87–5.11)
RDW: 11.5 % (ref 11.5–15.5)
WBC: 4.3 10*3/uL (ref 4.0–10.5)

## 2018-07-15 LAB — COMPREHENSIVE METABOLIC PANEL
ALT: 14 U/L (ref 0–44)
AST: 13 U/L — AB (ref 15–41)
Albumin: 3.9 g/dL (ref 3.5–5.0)
Alkaline Phosphatase: 36 U/L — ABNORMAL LOW (ref 38–126)
Anion gap: 11 (ref 5–15)
BILIRUBIN TOTAL: 0.7 mg/dL (ref 0.3–1.2)
BUN: 10 mg/dL (ref 6–20)
CO2: 23 mmol/L (ref 22–32)
Calcium: 8.5 mg/dL — ABNORMAL LOW (ref 8.9–10.3)
Chloride: 104 mmol/L (ref 98–111)
Creatinine, Ser: 0.7 mg/dL (ref 0.44–1.00)
Glucose, Bld: 107 mg/dL — ABNORMAL HIGH (ref 70–99)
POTASSIUM: 3.4 mmol/L — AB (ref 3.5–5.1)
Sodium: 138 mmol/L (ref 135–145)
TOTAL PROTEIN: 7.1 g/dL (ref 6.5–8.1)

## 2018-07-15 LAB — URINALYSIS, MICROSCOPIC (REFLEX)

## 2018-07-15 LAB — URINALYSIS, ROUTINE W REFLEX MICROSCOPIC
BILIRUBIN URINE: NEGATIVE
Glucose, UA: NEGATIVE mg/dL
KETONES UR: NEGATIVE mg/dL
Nitrite: NEGATIVE
PH: 6 (ref 5.0–8.0)
PROTEIN: NEGATIVE mg/dL
Specific Gravity, Urine: >1.03 — ABNORMAL HIGH (ref 1.005–1.030)

## 2018-07-15 LAB — LIPASE, BLOOD: LIPASE: 26 U/L (ref 11–51)

## 2018-07-15 LAB — PREGNANCY, URINE: PREG TEST UR: POSITIVE — AB

## 2018-07-15 MED ORDER — ONDANSETRON 4 MG PO TBDP
4.0000 mg | ORAL_TABLET | Freq: Once | ORAL | Status: AC
  Administered 2018-07-15: 4 mg via ORAL
  Filled 2018-07-15: qty 1

## 2018-07-15 MED ORDER — CEPHALEXIN 500 MG PO CAPS: 500.0000 mg | ORAL_CAPSULE | Freq: Two times a day (BID) | ORAL | 0 refills | Status: AC

## 2018-07-15 MED ORDER — METRONIDAZOLE 500 MG PO TABS: 500.0000 mg | ORAL_TABLET | Freq: Two times a day (BID) | ORAL | 0 refills | Status: DC

## 2018-07-15 MED ORDER — PANTOPRAZOLE SODIUM 40 MG PO TBEC
40.0000 mg | DELAYED_RELEASE_TABLET | Freq: Once | ORAL | Status: DC
  Filled 2018-07-15: qty 1

## 2018-07-15 NOTE — ED Provider Notes (Signed)
[facility] Emergency Department Provider Note MRN:  [id number]  Arrival date & time: 07/15/18     Chief Complaint   Emesis   History of Present Illness   Melanie Pineda is a 32 y.o. year-old female with a history of gastric ulcer presenting to the ED with chief complaint of emesis.  Multiple episodes of emesis yesterday, sometimes with blood.  Continued nausea.  Denies headache or vision change, no shortness of breath, intermittent sharp central chest pain.  Also endorsing crampy lower abdominal pain, 2 weeks late for her menstrual cycle.  Denies dysuria, no fever.  Review of Systems  A complete 10 system review of systems was obtained and all systems are negative except as noted in the HPI and PMH.   Patient's Health History    Past Medical History:  Diagnosis Date  . Anemia   . Bipolar 1 disorder (HCC)   . IBS (irritable bowel syndrome)   . Reflux esophagitis     History reviewed. No pertinent surgical history.  No family history on file.  Social History   Socioeconomic History  . Marital status: Single    Spouse name: Not on file  . Number of children: Not on file  . Years of education: Not on file  . Highest education level: Not on file  Occupational History  . Not on file  Social Needs  . Financial resource strain: Not on file  . Food insecurity:    Worry: Not on file    Inability: Not on file  . Transportation needs:    Medical: Not on file    Non-medical: Not on file  Tobacco Use  . Smoking status: Former Games developer  . Smokeless tobacco: Never Used  Substance and Sexual Activity  . Alcohol use: No  . Drug use: Yes    Types: Marijuana  . Sexual activity: Not on file  Lifestyle  . Physical activity:    Days per week: Not on file    Minutes per session: Not on file  . Stress: Not on file  Relationships  . Social connections:    Talks on phone: Not on file    Gets together: Not on file    Attends religious service: Not on file   Active member of club or organization: Not on file    Attends meetings of clubs or organizations: Not on file    Relationship status: Not on file  . Intimate partner violence:    Fear of current or ex partner: Not on file    Emotionally abused: Not on file    Physically abused: Not on file    Forced sexual activity: Not on file  Other Topics Concern  . Not on file  Social History Narrative  . Not on file     Physical Exam  Vital Signs and Nursing Notes reviewed Vitals:   07/15/18 0831  BP: 116/82  Pulse: 69  Resp: 16  Temp: 98.5 F (36.9 C)  SpO2: 99%    CONSTITUTIONAL: Well-appearing, NAD NEURO:  Alert and oriented x 3, no focal deficits EYES:  eyes equal and reactive ENT/NECK:  no LAD, no JVD CARDIO: Regular rate, well-perfused, normal S1 and S2 PULM:  CTAB no wheezing or rhonchi GI/GU:  normal bowel sounds, non-distended, non-tender MSK/SPINE:  No gross deformities, no edema SKIN:  no rash, atraumatic PSYCH:  Appropriate speech and behavior  Diagnostic and Interventional Summary    EKG Interpretation  Date/Time:  Monday July 15 2018 09:45:46 EDT  Ventricular Rate:  55 PR Interval:    QRS Duration: 80 QT Interval:  412 QTC Calculation: 394 R Axis:   89 Text Interpretation:  Sinus rhythm Confirmed by Kennis Carina [phone]) on 07/15/2018 10:23:03 AM      Labs Reviewed  PREGNANCY, URINE - Abnormal; Notable for the following components:      Result Value   Preg Test, Ur POSITIVE (*)    All other components within normal limits  URINALYSIS, ROUTINE W REFLEX MICROSCOPIC - Abnormal; Notable for the following components:   APPearance CLOUDY (*)    Specific Gravity, Urine >1.030 (*)    Hgb urine dipstick MODERATE (*)    Leukocytes, UA SMALL (*)    All other components within normal limits  URINALYSIS, MICROSCOPIC (REFLEX) - Abnormal; Notable for the following components:   Bacteria, UA MANY (*)    Trichomonas, UA PRESENT (*)    All other components within  normal limits  COMPREHENSIVE METABOLIC PANEL - Abnormal; Notable for the following components:   Potassium 3.4 (*)    Glucose, Bld 107 (*)    Calcium 8.5 (*)    AST 13 (*)    Alkaline Phosphatase 36 (*)    All other components within normal limits  CBC  LIPASE, BLOOD    DG Chest 2 View  Final Result      Medications  ondansetron (ZOFRAN-ODT) disintegrating tablet 4 mg (4 mg Oral Given 07/15/18 0956)     Procedures Critical Care  ED Course and Medical Decision Making  I have reviewed the triage vital signs and the nursing notes.  Pertinent labs & imaging results that were available during my care of the patient were reviewed by me and considered in my medical decision making (see below for details).    Question of pregnancy versus bleeding gastric ulcer versus more benign gastritis.  Work-up pending.  Positive pregnancy test, urine demonstrating trichomonas as well as bacteria.  Labs unremarkable, H&H normal.  No further vomiting here in the ED, no evidence of active GI bleed.  Favoring morning sickness, possibly very small bleeding due to irritation of the esophagus earlier today.  Patient counseled on following up with an OB doctor, prescription for metronidazole Keflex.  After the discussed management above, the patient was determined to be safe for discharge.  The patient was in agreement with this plan and all questions regarding their care were answered.  ED return precautions were discussed and the patient will return to the ED with any significant worsening of condition.  Elmer Sow. Pilar Plate, MD [facility] Emergency Medicine [facility] [email or web address]  Final Clinical Impressions(s) / ED Diagnoses     ICD-10-CM   1. Nausea and vomiting, intractability of vomiting not specified, unspecified vomiting type R11.2   2. Chest pain R07.9 DG Chest 2 View    DG Chest 2 View  3. Less than [redacted] weeks gestation of pregnancy Z3A.01   4. Trichomoniasis A59.9       ED Discharge Orders         Ordered    metroNIDAZOLE (FLAGYL) 500 MG tablet  2 times daily     07/15/18 1059    cephALEXin (KEFLEX) 500 MG capsule  2 times daily     07/15/18 1059             Sabas Sous, MD 07/15/18 1102

## 2018-07-15 NOTE — ED Triage Notes (Signed)
Pt states vomit blood this am , feels nauseated, 2wks late on menstrual cycle. Had negative preg test at home

## 2018-07-15 NOTE — Discharge Instructions (Signed)
You were evaluated in the Emergency Department and after careful evaluation, we did not find any emergent condition requiring admission or further testing in the hospital.  Your symptoms today seem to be due to early pregnancy.  Your urine tested positive for bacteria as well as trichomonas.  Please take the prescriptions provided as directed and follow-up with an OB doctor.  Please return to the Emergency Department if you experience any worsening of your condition.  We encourage you to follow up with a primary care provider.  Thank you for allowing [place] to be a part of your care.

## 2018-07-15 NOTE — ED Notes (Signed)
Pt/family verbalized understanding of discharge instructions.   

## 2018-07-17 ENCOUNTER — Other Ambulatory Visit: Payer: Self-pay

## 2018-07-17 ENCOUNTER — Emergency Department (HOSPITAL_BASED_OUTPATIENT_CLINIC_OR_DEPARTMENT_OTHER)
Admission: EM | Admit: 2018-07-17 | Discharge: 2018-07-17 | Disposition: A | Discharge status: Home / Self Care | Payer: Medicaid Other | Attending: Emergency Medicine | Admitting: Emergency Medicine

## 2018-07-17 ENCOUNTER — Encounter (HOSPITAL_BASED_OUTPATIENT_CLINIC_OR_DEPARTMENT_OTHER): Payer: Self-pay

## 2018-07-17 ENCOUNTER — Emergency Department (HOSPITAL_BASED_OUTPATIENT_CLINIC_OR_DEPARTMENT_OTHER): Payer: Medicaid Other

## 2018-07-17 DIAGNOSIS — Z87891 Personal history of nicotine dependence: Secondary | ICD-10-CM | POA: Diagnosis not present

## 2018-07-17 DIAGNOSIS — O209 Hemorrhage in early pregnancy, unspecified: Secondary | ICD-10-CM | POA: Insufficient documentation

## 2018-07-17 DIAGNOSIS — O9989 Other specified diseases and conditions complicating pregnancy, childbirth and the puerperium: Secondary | ICD-10-CM | POA: Insufficient documentation

## 2018-07-17 DIAGNOSIS — R1030 Lower abdominal pain, unspecified: Secondary | ICD-10-CM | POA: Insufficient documentation

## 2018-07-17 DIAGNOSIS — O039 Complete or unspecified spontaneous abortion without complication: Secondary | ICD-10-CM | POA: Diagnosis not present

## 2018-07-17 DIAGNOSIS — Z3A01 Less than 8 weeks gestation of pregnancy: Secondary | ICD-10-CM | POA: Diagnosis not present

## 2018-07-17 DIAGNOSIS — N939 Abnormal uterine and vaginal bleeding, unspecified: Secondary | ICD-10-CM

## 2018-07-17 LAB — CBC WITH DIFFERENTIAL/PLATELET
BASOS ABS: 0 10*3/uL (ref 0.0–0.1)
BASOS PCT: 0 %
EOS ABS: 0.1 10*3/uL (ref 0.0–0.7)
EOS PCT: 3 %
HCT: 38.6 % (ref 36.0–46.0)
Hemoglobin: 13.8 g/dL (ref 12.0–15.0)
Lymphocytes Relative: 35 %
Lymphs Abs: 1.7 10*3/uL (ref 0.7–4.0)
MCH: 33.3 pg (ref 26.0–34.0)
MCHC: 35.8 g/dL (ref 30.0–36.0)
MCV: 93 fL (ref 78.0–100.0)
MONO ABS: 0.5 10*3/uL (ref 0.1–1.0)
Monocytes Relative: 10 %
Neutro Abs: 2.6 10*3/uL (ref 1.7–7.7)
Neutrophils Relative %: 52 %
Platelets: 217 10*3/uL (ref 150–400)
RBC: 4.15 MIL/uL (ref 3.87–5.11)
RDW: 11.5 % (ref 11.5–15.5)
WBC: 4.9 10*3/uL (ref 4.0–10.5)

## 2018-07-17 LAB — RH IG WORKUP (INCLUDES ABO/RH)
ABO/RH(D): A POS
GESTATIONAL AGE(WKS): 8

## 2018-07-17 LAB — COMPREHENSIVE METABOLIC PANEL
ALK PHOS: 38 U/L (ref 38–126)
ALT: 13 U/L (ref 0–44)
AST: 15 U/L (ref 15–41)
Albumin: 4.2 g/dL (ref 3.5–5.0)
Anion gap: 8 (ref 5–15)
BILIRUBIN TOTAL: 0.4 mg/dL (ref 0.3–1.2)
BUN: 10 mg/dL (ref 6–20)
CALCIUM: 9.1 mg/dL (ref 8.9–10.3)
CO2: 25 mmol/L (ref 22–32)
CREATININE: 0.59 mg/dL (ref 0.44–1.00)
Chloride: 104 mmol/L (ref 98–111)
GFR calc Af Amer: >60 mL/min (ref >60)
Glucose, Bld: 98 mg/dL (ref 70–99)
Potassium: 3.3 mmol/L — ABNORMAL LOW (ref 3.5–5.1)
Sodium: 137 mmol/L (ref 135–145)
TOTAL PROTEIN: 7.4 g/dL (ref 6.5–8.1)

## 2018-07-17 LAB — HCG, QUANTITATIVE, PREGNANCY: hCG, Beta Chain, Quant, S: 6 m[IU]/mL — ABNORMAL HIGH (ref <5)

## 2018-07-17 NOTE — Discharge Instructions (Addendum)
These follow-up with your OB/GYN within 2 weeks and obtain a beta hCG for confirmation of your miscarriage.  If you experience any fever, severe abdominal pain, increase in vaginal bleeding please return to the ED for reevaluation.

## 2018-07-17 NOTE — ED Notes (Signed)
ED Provider at bedside. 

## 2018-07-17 NOTE — ED Provider Notes (Signed)
[facility] EMERGENCY DEPARTMENT Provider Note   CSN: [id number] Arrival date & time: 07/17/18  1111     History   Chief Complaint Chief Complaint  Patient presents with  . Vaginal Bleeding    HPI Melanie Pineda is a 32 y.o. female.  32 y.o female G3P2 currently [redacted] weeks pregnant with no a PMH of Anemia,IBS presents to the ED with a chief complaint of vaginal bleeding x 1 days. Patient reports she had a positive pregnancy in the ED 10 days ago. She reports she was sleeping with a pd and liner when she woke up this morning "covered in blood". She reports feeling slight crampy pain around her lower abdomen . She was at work this morning when she used the bathroom and noted multiple clots all over the toilet bowl. She has not received any prenatal or OB care as she is waiting on her insurance card. She denies any fever, shortness of breath, nausea, vomiting or chest pain. Patient does smoke marijuana but states she has stopped since she found out she was pregnant.      Past Medical History:  Diagnosis Date  . Anemia   . Bipolar 1 disorder (HCC)   . IBS (irritable bowel syndrome)   . Reflux esophagitis     There are no active problems to display for this patient.   History reviewed. No pertinent surgical history.   OB History    Gravida  3   Para  2   Term  2   Preterm      AB      Living  2     SAB      TAB      Ectopic      Multiple      Live Births  1            Home Medications    Prior to Admission medications   Medication Sig Start Date End Date Taking? Authorizing Provider  cephALEXin (KEFLEX) 500 MG capsule Take 1 capsule (500 mg total) by mouth 2 (two) times daily for 7 days. 07/15/18 07/22/18 Yes Bero, Elmer Sow, MD  metroNIDAZOLE (FLAGYL) 500 MG tablet Take 1 tablet (500 mg total) by mouth 2 (two) times daily. 04/29/18  Yes Donette Larry, CNM  metroNIDAZOLE (FLAGYL) 500 MG tablet Take 1 tablet (500 mg total) by mouth 2 (two) times  daily. 07/15/18  Yes Sabas Sous, MD  busPIRone (BUSPAR) 10 MG tablet Take 10 mg by mouth 2 (two) times daily.    [provider]  Cariprazine HCl (VRAYLAR) 1.5 MG CAPS Take 1.5 mg by mouth daily.     [provider]  clonazePAM (KLONOPIN) 1 MG tablet Take 1 mg by mouth 2 (two) times daily.    [provider]  cyclobenzaprine (FLEXERIL) 10 MG tablet Take 1 tablet (10 mg total) by mouth 2 (two) times daily as needed for muscle spasms. 06/10/18   Kirichenko, Tatyana, PA-C  dicyclomine (BENTYL) 20 MG tablet Take 1 tablet (20 mg total) by mouth 2 (two) times daily. 05/17/18   Law, Waylan Boga, PA-C  ibuprofen (ADVIL,MOTRIN) 600 MG tablet Take 1 tablet (600 mg total) by mouth every 6 (six) hours as needed. 04/29/18   Donette Larry, CNM  meloxicam (MOBIC) 15 MG tablet Take 1 tablet (15 mg total) by mouth daily. 06/10/18   Jaynie Crumble, PA-C    Family History No family history on file.  Social History Social History   Tobacco Use  .  Smoking status: Former Games developer  . Smokeless tobacco: Never Used  Substance Use Topics  . Alcohol use: No  . Drug use: Yes    Types: Marijuana     Allergies   Ultram [tramadol] and Bactrim [sulfamethoxazole-trimethoprim]   Review of Systems Review of Systems  Constitutional: Negative for chills.  HENT: Negative for sore throat.   Respiratory: Negative for shortness of breath.   Cardiovascular: Negative for chest pain.  Gastrointestinal: Positive for abdominal pain (lower ). Negative for diarrhea, nausea and vomiting.  Genitourinary: Negative for flank pain and hematuria.  Musculoskeletal: Negative for back pain.  Skin: Negative for pallor and wound.  Neurological: Negative for syncope, weakness, light-headedness and headaches.  All other systems reviewed and are negative.    Physical Exam Updated Vital Signs BP 119/78 (BP Location: Right Arm)   Pulse 62   Temp 98.3 F (36.8 C) (Oral)   Resp 16   Ht 5\' 6"  (1.676  m)   Wt 68 kg   LMP 06/02/2018 (Within Days) Comment: positive upreg in er today.  SpO2 100%   BMI 24.21 kg/m   Physical Exam  Constitutional: She is oriented to person, place, and time. She appears well-developed and well-nourished.  HENT:  Head: Normocephalic and atraumatic.  Neck: Normal range of motion. Neck supple.  Cardiovascular: Normal heart sounds.  Pulmonary/Chest: Effort normal and breath sounds normal. She has no decreased breath sounds. She has no wheezes.  Lungs clear throughout all fields  Abdominal: Soft. Bowel sounds are normal. There is no tenderness.  Genitourinary: Pelvic exam was performed with patient supine.  Genitourinary Comments: Bleeding present on vaginal vault, no clots present. Chaperoned by Amy RN   Musculoskeletal: She exhibits no tenderness or deformity.  Neurological: She is alert and oriented to person, place, and time.  Skin: Skin is warm and dry.  Nursing note and vitals reviewed.    ED Treatments / Results  Labs (all labs ordered are listed, but only abnormal results are displayed) Labs Reviewed  HCG, QUANTITATIVE, PREGNANCY - Abnormal; Notable for the following components:      Result Value   hCG, Beta Chain, Quant, S 6 (*)    All other components within normal limits  COMPREHENSIVE METABOLIC PANEL - Abnormal; Notable for the following components:   Potassium 3.3 (*)    All other components within normal limits  CBC WITH DIFFERENTIAL/PLATELET  RH IG WORKUP (INCLUDES ABO/RH)    EKG None  Radiology US Ob Comp < 14 Wks  Result Date: 07/17/2018 CLINICAL DATA:  First trimester of pregnancy, vaginal bleeding. EXAM: OBSTETRIC <14 WK [place] AND TRANSVAGINAL OB US TECHNIQUE: Both transabdominal and transvaginal ultrasound examinations were performed for complete evaluation of the gestation as well as the maternal uterus, adnexal regions, and pelvic cul-de-sac. Transvaginal technique was performed to assess early pregnancy. COMPARISON:  None.  FINDINGS: Intrauterine gestational sac: Not visualized. Yolk sac:  Not visualized. Embryo:  Not visualized. Cardiac Activity: Not visualized. Subchorionic hemorrhage:  None visualized. Maternal uterus/adnexae: Ovaries are unremarkable. Trace free fluid is noted which most likely is physiologic. IMPRESSION: No intrauterine gestational sac, yolk sac, fetal pole, or cardiac activity visualized. Differential considerations include intrauterine gestation too early to be sonographically visualized, spontaneous abortion, or ectopic pregnancy. Consider follow-up ultrasound in 14 days and serial quantitative beta HCG follow-up. Electronically Signed   By: Lupita Raider, M.D.   On: 07/17/2018 16:21   US Ob Transvaginal  Result Date: 07/17/2018 CLINICAL DATA:  First trimester of pregnancy, vaginal  bleeding. EXAM: OBSTETRIC <14 WK [place] AND TRANSVAGINAL OB US TECHNIQUE: Both transabdominal and transvaginal ultrasound examinations were performed for complete evaluation of the gestation as well as the maternal uterus, adnexal regions, and pelvic cul-de-sac. Transvaginal technique was performed to assess early pregnancy. COMPARISON:  None. FINDINGS: Intrauterine gestational sac: Not visualized. Yolk sac:  Not visualized. Embryo:  Not visualized. Cardiac Activity: Not visualized. Subchorionic hemorrhage:  None visualized. Maternal uterus/adnexae: Ovaries are unremarkable. Trace free fluid is noted which most likely is physiologic. IMPRESSION: No intrauterine gestational sac, yolk sac, fetal pole, or cardiac activity visualized. Differential considerations include intrauterine gestation too early to be sonographically visualized, spontaneous abortion, or ectopic pregnancy. Consider follow-up ultrasound in 14 days and serial quantitative beta HCG follow-up. Electronically Signed   By: Lupita Raider, M.D.   On: 07/17/2018 16:21    Procedures Procedures (including critical care time)  Medications Ordered in ED Medications -  No data to display   Initial Impression / Assessment and Plan / ED Course  I have reviewed the triage vital signs and the nursing notes.  Pertinent labs & imaging results that were available during my care of the patient were reviewed by me and considered in my medical decision making (see chart for details).  Clinical Course as of Jul 18 1715  Wed Jul 17, 2018  1532 No results from previous visit to compare this to    [JS]  1701 HCG, Beta Chain, Quant, S(!): 6 [JS]    Clinical Course User Index [JS] Claude Manges, PA-C    G3 P2 currently [redacted] weeks pregnant presents with vaginal bleeding x1 day.  Patient was seen in the ED 10 days ago for emesis and had a positive pregnancy test. She has had no OB follow up or prenatal care.She reports waking up this morning "covered in blood". She has gone through 1 pad today. She states being at work and seeing a lot of blood in the toilet bowl.  Pelvic exam showed significant amount of blood in the vaginal vault. Will order US OB to r/o ectopic, spontaneous abortion.  CMP showed slight decrease in potassium 3.3.CBC showed no leukocytosis, patient is afebrile.hCG was 6, there is no comparing number from previous visit.   Ultrasound OB showed no intrauterine gestational sac, yolk sac, fetal pole, or cardiac activity was visualized.  She did not obtain a beta hCG last time she was here, her beta hCG today is 6 at this time I have advised patient she is probably miscarrying, she will need her Hcg redrawn in 1 week.  Patient has establish care with an OB/GYN that she was planning on seeing the following week.  She is advised to return to the emergency room if she experiences worsening bleeding, fever, lightheadedness.  Patient states she would like a work note, I will provide this for patient.  Precautions have been discussed at length.  Final Clinical Impressions(s) / ED Diagnoses   Final diagnoses:  Vaginal bleeding  Miscarriage    ED Discharge Orders      None       Claude Manges, PA-C 07/17/18 1716    Alvira Monday, MD 07/18/18 [phone]

## 2018-07-17 NOTE — ED Triage Notes (Signed)
C/o vaginal bleeding x 2 days-states she was seen here 2 days ago with pos pregnancy-NAD-steady gait

## 2018-07-26 ENCOUNTER — Other Ambulatory Visit: Payer: Self-pay

## 2018-07-26 ENCOUNTER — Emergency Department (HOSPITAL_BASED_OUTPATIENT_CLINIC_OR_DEPARTMENT_OTHER)
Admission: EM | Admit: 2018-07-26 | Discharge: 2018-07-26 | Disposition: A | Discharge status: Home / Self Care | Payer: Medicaid Other | Attending: Emergency Medicine | Admitting: Emergency Medicine

## 2018-07-26 ENCOUNTER — Encounter (HOSPITAL_BASED_OUTPATIENT_CLINIC_OR_DEPARTMENT_OTHER): Payer: Self-pay

## 2018-07-26 DIAGNOSIS — Z79899 Other long term (current) drug therapy: Secondary | ICD-10-CM | POA: Insufficient documentation

## 2018-07-26 DIAGNOSIS — Z87891 Personal history of nicotine dependence: Secondary | ICD-10-CM | POA: Insufficient documentation

## 2018-07-26 DIAGNOSIS — M25511 Pain in right shoulder: Secondary | ICD-10-CM | POA: Diagnosis present

## 2018-07-26 NOTE — ED Provider Notes (Signed)
[facility] EMERGENCY DEPARTMENT Provider Note   CSN: [id number] Arrival date & time: 07/26/18  1223     History   Chief Complaint Chief Complaint  Patient presents with  . Shoulder Pain    HPI Melanie Pineda is a 32 y.o. female.  Patient is a 32 year old female with history of bipolar, irritable bowel.  She presents today for evaluation of right shoulder pain.  This began this morning upon wakening in the absence of any injury or trauma.  She denies any numbness or tingling.  She denies any weakness.  The history is provided by the patient.  Shoulder Pain   This is a new problem. Episode onset: This morning. The problem occurs constantly. The problem has been rapidly worsening. Pain location: Right shoulder. The pain is moderate. Pertinent negatives include no numbness and full range of motion. The symptoms are aggravated by activity. She has tried nothing for the symptoms.    Past Medical History:  Diagnosis Date  . Anemia   . Bipolar 1 disorder (HCC)   . IBS (irritable bowel syndrome)   . Reflux esophagitis     There are no active problems to display for this patient.   History reviewed. No pertinent surgical history.   OB History    Gravida  3   Para  2   Term  2   Preterm      AB      Living  2     SAB      TAB      Ectopic      Multiple      Live Births  1            Home Medications    Prior to Admission medications   Medication Sig Start Date End Date Taking? Authorizing Provider  Cariprazine HCl (VRAYLAR) 1.5 MG CAPS Take 1.5 mg by mouth daily.    Yes [provider]  clonazePAM (KLONOPIN) 1 MG tablet Take 1 mg by mouth 2 (two) times daily.   Yes [provider]  metroNIDAZOLE (FLAGYL) 500 MG tablet Take 1 tablet (500 mg total) by mouth 2 (two) times daily. 07/15/18  Yes Sabas Sous, MD  busPIRone (BUSPAR) 10 MG tablet Take 10 mg by mouth 2 (two) times daily.    [provider]  cyclobenzaprine  (FLEXERIL) 10 MG tablet Take 1 tablet (10 mg total) by mouth 2 (two) times daily as needed for muscle spasms. 06/10/18   Kirichenko, Tatyana, PA-C  dicyclomine (BENTYL) 20 MG tablet Take 1 tablet (20 mg total) by mouth 2 (two) times daily. 05/17/18   Law, Waylan Boga, PA-C  ibuprofen (ADVIL,MOTRIN) 600 MG tablet Take 1 tablet (600 mg total) by mouth every 6 (six) hours as needed. 04/29/18   Donette Larry, CNM  meloxicam (MOBIC) 15 MG tablet Take 1 tablet (15 mg total) by mouth daily. 06/10/18   Kirichenko, Tatyana, PA-C  metroNIDAZOLE (FLAGYL) 500 MG tablet Take 1 tablet (500 mg total) by mouth 2 (two) times daily. 04/29/18   Donette Larry, CNM    Family History No family history on file.  Social History Social History   Tobacco Use  . Smoking status: Former Games developer  . Smokeless tobacco: Never Used  Substance Use Topics  . Alcohol use: No  . Drug use: Yes    Types: Marijuana     Allergies   Ultram [tramadol] and Bactrim [sulfamethoxazole-trimethoprim]   Review of Systems Review of Systems  Neurological: Negative for  numbness.  All other systems reviewed and are negative.    Physical Exam Updated Vital Signs BP 115/84 (BP Location: Left Arm)   Pulse 60   Temp 99 F (37.2 C) (Oral)   Resp 16   Ht 5\' 6"  (1.676 m)   Wt 69.9 kg   LMP 05/31/2018 (Approximate) Comment: recent SAb  SpO2 100%   Breastfeeding? Unknown   BMI 24.86 kg/m   Physical Exam  Constitutional: She is oriented to person, place, and time. She appears well-developed and well-nourished. No distress.  HENT:  Head: Normocephalic and atraumatic.  Neck: Normal range of motion. Neck supple.  Pulmonary/Chest: Effort normal.  Musculoskeletal: Normal range of motion.  The right shoulder appears grossly normal.  There is tenderness to palpation over the anterior deltoid and mid clavicular region.  The strength is 5 out of 5 throughout the right upper extremity.  She is able to flex, extend, and oppose all  fingers.  Sensation is intact throughout the entire hand.  Ulnar and radial pulses are easily palpable.  She has pain with range of motion of the shoulder.  Neurological: She is alert and oriented to person, place, and time.  Skin: Skin is warm and dry. She is not diaphoretic.  Nursing note and vitals reviewed.    ED Treatments / Results  Labs (all labs ordered are listed, but only abnormal results are displayed) Labs Reviewed - No data to display  EKG None  Radiology No results found.  Procedures Procedures (including critical care time)  Medications Ordered in ED Medications - No data to display   Initial Impression / Assessment and Plan / ED Course  I have reviewed the triage vital signs and the nursing notes.  Pertinent labs & imaging results that were available during my care of the patient were reviewed by me and considered in my medical decision making (see chart for details).  Patient with right shoulder pain that is atraumatic and I suspect soft tissue in nature.  She has good range of motion, but with some discomfort.  This will be treated as a musculoskeletal pain with anti-inflammatories, arm sling, and follow-up as needed.  Final Clinical Impressions(s) / ED Diagnoses   Final diagnoses:  None    ED Discharge Orders    None       Geoffery Lyons, MD 07/26/18 1338

## 2018-07-26 NOTE — ED Triage Notes (Signed)
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

## 2018-07-26 NOTE — Discharge Instructions (Addendum)
Wear sling as applied for rest and comfort.  Ibuprofen 600 mg every 6 hours for the next 5 days.  Follow-up with your primary doctor if symptoms are not improving in the next week.

## 2018-08-06 ENCOUNTER — Encounter (HOSPITAL_BASED_OUTPATIENT_CLINIC_OR_DEPARTMENT_OTHER): Payer: Self-pay | Admitting: *Deleted

## 2018-08-06 ENCOUNTER — Other Ambulatory Visit: Payer: Self-pay

## 2018-08-06 ENCOUNTER — Emergency Department (HOSPITAL_BASED_OUTPATIENT_CLINIC_OR_DEPARTMENT_OTHER)
Admission: EM | Admit: 2018-08-06 | Discharge: 2018-08-06 | Disposition: A | Discharge status: Home / Self Care | Payer: Medicaid Other | Attending: Emergency Medicine | Admitting: Emergency Medicine

## 2018-08-06 DIAGNOSIS — N898 Other specified noninflammatory disorders of vagina: Secondary | ICD-10-CM | POA: Insufficient documentation

## 2018-08-06 DIAGNOSIS — Z79899 Other long term (current) drug therapy: Secondary | ICD-10-CM | POA: Diagnosis not present

## 2018-08-06 LAB — WET PREP, GENITAL
Clue Cells Wet Prep HPF POC: NONE SEEN
Sperm: NONE SEEN
Trich, Wet Prep: NONE SEEN
Yeast Wet Prep HPF POC: NONE SEEN

## 2018-08-06 LAB — URINALYSIS, ROUTINE W REFLEX MICROSCOPIC
BILIRUBIN URINE: NEGATIVE
Glucose, UA: NEGATIVE mg/dL
Hgb urine dipstick: NEGATIVE
Ketones, ur: NEGATIVE mg/dL
LEUKOCYTES UA: NEGATIVE
NITRITE: NEGATIVE
PH: 6.5 (ref 5.0–8.0)
Protein, ur: NEGATIVE mg/dL

## 2018-08-06 LAB — PREGNANCY, URINE: Preg Test, Ur: NEGATIVE

## 2018-08-06 NOTE — ED Provider Notes (Signed)
[facility] EMERGENCY DEPARTMENT Provider Note   CSN: [id number] Arrival date & time: 08/06/18  1050     History   Chief Complaint Chief Complaint  Patient presents with  . Vaginal Itching    HPI Melanie Pineda is a 32 y.o. female with history of bipolar 1 disorder, anemia, IBS, recent miscarriage who presents with a 2-1/2-week history of vaginal itching and discharge.  She reports a 2-day history of some intermittent lower pelvic pain.  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  She has had some dysuria as well.  She reports she was recently treated for UTI and trichomonas. She has not been sexually active since.  Her sexual partner was treated for trichomonas.  She denies any nausea, vomiting, chest pain, shortness of breath, fevers.  She reports some muscular low back pain that began today, however she reports she has been doing heavy lifting at work.  HPI  Past Medical History:  Diagnosis Date  . Anemia   . Bipolar 1 disorder (HCC)   . IBS (irritable bowel syndrome)   . Reflux esophagitis     There are no active problems to display for this patient.   History reviewed. No pertinent surgical history.   OB History    Gravida  3   Para  2   Term  2   Preterm      AB      Living  2     SAB      TAB      Ectopic      Multiple      Live Births  1            Home Medications    Prior to Admission medications   Medication Sig Start Date End Date Taking? Authorizing Provider  busPIRone (BUSPAR) 10 MG tablet Take 10 mg by mouth 2 (two) times daily.    [provider]  Cariprazine HCl (VRAYLAR) 1.5 MG CAPS Take 1.5 mg by mouth daily.     [provider]  clonazePAM (KLONOPIN) 1 MG tablet Take 1 mg by mouth 2 (two) times daily.    [provider]  cyclobenzaprine (FLEXERIL) 10 MG tablet Take 1 tablet (10 mg total) by mouth 2 (two) times daily as needed for muscle spasms. 06/10/18   Kirichenko,  Tatyana, PA-C  dicyclomine (BENTYL) 20 MG tablet Take 1 tablet (20 mg total) by mouth 2 (two) times daily. 05/17/18   Haadi Santellan, Waylan Boga, PA-C  ibuprofen (ADVIL,MOTRIN) 600 MG tablet Take 1 tablet (600 mg total) by mouth every 6 (six) hours as needed. 04/29/18   Donette Larry, CNM  meloxicam (MOBIC) 15 MG tablet Take 1 tablet (15 mg total) by mouth daily. 06/10/18   Kirichenko, Tatyana, PA-C  metroNIDAZOLE (FLAGYL) 500 MG tablet Take 1 tablet (500 mg total) by mouth 2 (two) times daily. 04/29/18   Donette Larry, CNM  metroNIDAZOLE (FLAGYL) 500 MG tablet Take 1 tablet (500 mg total) by mouth 2 (two) times daily. 07/15/18   Sabas Sous, MD    Family History History reviewed. No pertinent family history.  Social History Social History   Tobacco Use  . Smoking status: Former Games developer  . Smokeless tobacco: Never Used  Substance Use Topics  . Alcohol use: No  . Drug use: Yes    Types: Marijuana     Allergies   Ultram [tramadol] and Bactrim [sulfamethoxazole-trimethoprim]   Review of Systems Review of Systems  Constitutional: Negative  for chills and fever.  HENT: Negative for facial swelling and sore throat.   Respiratory: Negative for shortness of breath.   Cardiovascular: Negative for chest pain.  Gastrointestinal: Negative for abdominal pain, nausea and vomiting.  Genitourinary: Positive for dysuria, pelvic pain and vaginal discharge. Negative for flank pain and hematuria.  Musculoskeletal: Negative for back pain.  Skin: Negative for rash and wound.  Neurological: Negative for headaches.  Psychiatric/Behavioral: The patient is not nervous/anxious.      Physical Exam Updated Vital Signs BP 114/86 (BP Location: Right Arm)   Pulse (!) 59   Temp 98.1 F (36.7 C) (Oral)   Resp 16   SpO2 97%   Physical Exam  Constitutional: She appears well-developed and well-nourished. No distress.  HENT:  Head: Normocephalic and atraumatic.  Mouth/Throat: Oropharynx is clear and moist.  No oropharyngeal exudate.  Eyes: Pupils are equal, round, and reactive to light. Conjunctivae are normal. Right eye exhibits no discharge. Left eye exhibits no discharge. No scleral icterus.  Neck: Normal range of motion. Neck supple. No thyromegaly present.  Cardiovascular: Normal rate, regular rhythm, normal heart sounds and intact distal pulses. Exam reveals no gallop and no friction rub.  No murmur heard. Pulmonary/Chest: Effort normal and breath sounds normal. No stridor. No respiratory distress. She has no wheezes. She has no rales.  Abdominal: Soft. Bowel sounds are normal. She exhibits no distension. There is no tenderness. There is no rebound and no guarding.  Genitourinary: Uterus normal. Cervix exhibits discharge (white/clear). Cervix exhibits no motion tenderness. Right adnexum displays no mass and no tenderness. Left adnexum displays no mass and no tenderness. No bleeding in the vagina.  Musculoskeletal: She exhibits no edema.  Lymphadenopathy:    She has no cervical adenopathy.  Neurological: She is alert. Coordination normal.  Skin: Skin is warm and dry. No rash noted. She is not diaphoretic. No pallor.  Psychiatric: She has a normal mood and affect.  Nursing note and vitals reviewed.    ED Treatments / Results  Labs (all labs ordered are listed, but only abnormal results are displayed) Labs Reviewed  WET PREP, GENITAL - Abnormal; Notable for the following components:      Result Value   WBC, Wet Prep HPF POC FEW (*)    All other components within normal limits  URINALYSIS, ROUTINE W REFLEX MICROSCOPIC - Abnormal; Notable for the following components:   Specific Gravity, Urine <1.005 (*)    All other components within normal limits  PREGNANCY, URINE  GC/CHLAMYDIA PROBE AMP ([place]) NOT AT [facility]    EKG None  Radiology No results found.  Procedures Procedures (including critical care time)  Medications Ordered in ED Medications - No data to  display   Initial Impression / Assessment and Plan / ED Course  I have reviewed the triage vital signs and the nursing notes.  Pertinent labs & imaging results that were available during my care of the patient were reviewed by me and considered in my medical decision making (see chart for details).     Patient reports her symptoms are intermittent.  Her UA is negative.  Urine pregnancy negative.  Wet prep is negative except for a few WBCs.  GC/chlamydia sent.  No concern for PID.  Will not initiate treatment at this time.  Advise follow-up to OB/GYN if symptoms are continuing.  Return precautions discussed.  Patient understands and agrees with plan.  Patient vitals stable here ED course and discharged in satisfactory condition. I discussed patient case  with Dr. Rush Landmark who guided the patient's management and agrees with plan.   Final Clinical Impressions(s) / ED Diagnoses   Final diagnoses:  Vaginal itching    ED Discharge Orders    None       Emi Holes, PA-C 08/06/18 1626    Tegeler, Canary Brim, MD 08/07/18 1452

## 2018-08-06 NOTE — Discharge Instructions (Addendum)
Please follow-up with the women's outpatient clinic for further evaluation and treatment of your symptoms if they are continuing. Please return to the emergency department or go to the [facility] emergency department if you develop any new or worsening symptoms.

## 2018-08-06 NOTE — ED Triage Notes (Signed)
Pt reports ongoing dysuria and vaginal itching, dx with trich and uti when she miscarried recently, took flagyl and keflex, cont with sx. Urine sent from triage.

## 2018-08-07 LAB — GC/CHLAMYDIA PROBE AMP (~~LOC~~) NOT AT ARMC
Chlamydia: NEGATIVE
Neisseria Gonorrhea: NEGATIVE

## 2018-08-20 ENCOUNTER — Other Ambulatory Visit: Payer: Self-pay

## 2018-08-20 ENCOUNTER — Emergency Department (HOSPITAL_BASED_OUTPATIENT_CLINIC_OR_DEPARTMENT_OTHER)
Admission: EM | Admit: 2018-08-20 | Discharge: 2018-08-20 | Disposition: A | Discharge status: Home / Self Care | Payer: Medicaid Other | Attending: Emergency Medicine | Admitting: Emergency Medicine

## 2018-08-20 ENCOUNTER — Encounter (HOSPITAL_BASED_OUTPATIENT_CLINIC_OR_DEPARTMENT_OTHER): Payer: Self-pay | Admitting: Emergency Medicine

## 2018-08-20 DIAGNOSIS — Z79899 Other long term (current) drug therapy: Secondary | ICD-10-CM | POA: Insufficient documentation

## 2018-08-20 DIAGNOSIS — B9689 Other specified bacterial agents as the cause of diseases classified elsewhere: Secondary | ICD-10-CM

## 2018-08-20 DIAGNOSIS — Z87891 Personal history of nicotine dependence: Secondary | ICD-10-CM | POA: Insufficient documentation

## 2018-08-20 DIAGNOSIS — N760 Acute vaginitis: Secondary | ICD-10-CM | POA: Insufficient documentation

## 2018-08-20 DIAGNOSIS — R102 Pelvic and perineal pain: Secondary | ICD-10-CM | POA: Diagnosis present

## 2018-08-20 DIAGNOSIS — N72 Inflammatory disease of cervix uteri: Secondary | ICD-10-CM | POA: Insufficient documentation

## 2018-08-20 LAB — URINALYSIS, ROUTINE W REFLEX MICROSCOPIC
BILIRUBIN URINE: NEGATIVE
Glucose, UA: NEGATIVE mg/dL
HGB URINE DIPSTICK: NEGATIVE
Ketones, ur: NEGATIVE mg/dL
Leukocytes, UA: NEGATIVE
NITRITE: NEGATIVE
Protein, ur: NEGATIVE mg/dL
Specific Gravity, Urine: 1.02 (ref 1.005–1.030)
pH: 7 (ref 5.0–8.0)

## 2018-08-20 LAB — WET PREP, GENITAL
Sperm: NONE SEEN
Trich, Wet Prep: NONE SEEN
YEAST WET PREP: NONE SEEN

## 2018-08-20 LAB — PREGNANCY, URINE: PREG TEST UR: NEGATIVE

## 2018-08-20 MED ORDER — METRONIDAZOLE 500 MG PO TABS: 2000.0000 mg | ORAL_TABLET | Freq: Once | ORAL | Status: DC

## 2018-08-20 MED ORDER — LIDOCAINE HCL (PF) 1 % IJ SOLN
INTRAMUSCULAR | Status: AC
  Administered 2018-08-20: 1.2 mL
  Filled 2018-08-20: qty 5

## 2018-08-20 MED ORDER — DOXYCYCLINE HYCLATE 100 MG PO CAPS: 100.0000 mg | ORAL_CAPSULE | Freq: Two times a day (BID) | ORAL | 0 refills | Status: DC

## 2018-08-20 MED ORDER — ONDANSETRON 4 MG PO TBDP
4.0000 mg | ORAL_TABLET | Freq: Once | ORAL | Status: AC
  Administered 2018-08-20: 4 mg via ORAL

## 2018-08-20 MED ORDER — ONDANSETRON 4 MG PO TBDP
ORAL_TABLET | ORAL | Status: AC
  Filled 2018-08-20: qty 1

## 2018-08-20 MED ORDER — CEFTRIAXONE SODIUM 250 MG IJ SOLR
250.0000 mg | Freq: Once | INTRAMUSCULAR | Status: AC
  Administered 2018-08-20: 250 mg via INTRAMUSCULAR
  Filled 2018-08-20: qty 250

## 2018-08-20 MED ORDER — AZITHROMYCIN 250 MG PO TABS
1000.0000 mg | ORAL_TABLET | Freq: Once | ORAL | Status: AC
  Administered 2018-08-20: 1000 mg via ORAL
  Filled 2018-08-20: qty 4

## 2018-08-20 MED ORDER — METRONIDAZOLE 500 MG PO TABS: 500.0000 mg | ORAL_TABLET | Freq: Two times a day (BID) | ORAL | 0 refills | Status: DC

## 2018-08-20 NOTE — ED Provider Notes (Signed)
[facility] EMERGENCY DEPARTMENT Provider Note   CSN: [id number] Arrival date & time: 08/20/18  0800     History   Chief Complaint Chief Complaint  Patient presents with  . Abdominal Pain    HPI Melanie Pineda is a 32 y.o. female.  32 yo F with a chief complaint of pelvic pain.  Worse with intercourse.  Going on for at least the past 6 months.  Has been to the multiple ED's for evaluation.  Denies fevers.  This pain is worsening over the past few days.  Had some whitish discharge about a week ago.  Also had spotting about a week ago.  Had a miscarriage about a month ago.  No menstrual cycle since.  Denies dysuria denies abdominal pain denies fever.  The history is provided by the patient.  Illness  This is a chronic problem. The current episode started more than 1 week ago. The problem occurs constantly. The problem has been gradually worsening. Pertinent negatives include no chest pain, no abdominal pain, no headaches and no shortness of breath. Nothing aggravates the symptoms. Nothing relieves the symptoms. She has tried nothing for the symptoms. The treatment provided no relief.    Past Medical History:  Diagnosis Date  . Anemia   . Bipolar 1 disorder (HCC)   . IBS (irritable bowel syndrome)   . Reflux esophagitis     There are no active problems to display for this patient.   History reviewed. No pertinent surgical history.   OB History    Gravida  3   Para  2   Term  2   Preterm      AB      Living  2     SAB      TAB      Ectopic      Multiple      Live Births  1            Home Medications    Prior to Admission medications   Medication Sig Start Date End Date Taking? Authorizing Provider  busPIRone (BUSPAR) 10 MG tablet Take 10 mg by mouth 2 (two) times daily.    [provider]  Cariprazine HCl (VRAYLAR) 1.5 MG CAPS Take 1.5 mg by mouth daily.     [provider]  clonazePAM (KLONOPIN) 1 MG tablet Take 1 mg by  mouth 2 (two) times daily.    [provider]  doxycycline (VIBRAMYCIN) 100 MG capsule Take 1 capsule (100 mg total) by mouth 2 (two) times daily. One po bid x 7 days 08/20/18   Melene Plan, DO  metroNIDAZOLE (FLAGYL) 500 MG tablet Take 1 tablet (500 mg total) by mouth 2 (two) times daily. One po bid x 7 days 08/20/18   Melene Plan, DO    Family History No family history on file.  Social History Social History   Tobacco Use  . Smoking status: Former Games developer  . Smokeless tobacco: Never Used  Substance Use Topics  . Alcohol use: No  . Drug use: Yes    Types: Marijuana     Allergies   Ultram [tramadol] and Bactrim [sulfamethoxazole-trimethoprim]   Review of Systems Review of Systems  Constitutional: Negative for chills and fever.  HENT: Negative for congestion and rhinorrhea.   Eyes: Negative for redness and visual disturbance.  Respiratory: Negative for shortness of breath and wheezing.   Cardiovascular: Negative for chest pain and palpitations.  Gastrointestinal: Negative for abdominal pain, nausea and vomiting.  Genitourinary: Positive for dyspareunia, pelvic pain, vaginal bleeding (spotting earlier in the week now resolved) and vaginal discharge. Negative for dysuria and urgency.  Musculoskeletal: Negative for arthralgias and myalgias.  Skin: Negative for pallor and wound.  Neurological: Negative for dizziness and headaches.     Physical Exam Updated Vital Signs BP 122/86 (BP Location: Left Arm)   Pulse 72   Temp 98.4 F (36.9 C) (Oral)   Resp 16   Ht 5\' 6"  (1.676 m)   Wt 69 kg   SpO2 98%   BMI 24.55 kg/m   Physical Exam  Constitutional: She is oriented to person, place, and time. She appears well-developed and well-nourished. No distress.  HENT:  Head: Normocephalic and atraumatic.  Eyes: Pupils are equal, round, and reactive to light. EOM are normal.  Neck: Normal range of motion. Neck supple.  Cardiovascular: Normal rate and regular rhythm. Exam  reveals no gallop and no friction rub.  No murmur heard. Pulmonary/Chest: Effort normal. She has no wheezes. She has no rales.  Abdominal: Soft. She exhibits no distension. There is no tenderness.  Genitourinary: Uterus is tender. Cervix exhibits motion tenderness and discharge (whitish, thick). Right adnexum displays no mass and no tenderness. Left adnexum displays no mass and no tenderness. No tenderness in the vagina. Vaginal discharge found.  Musculoskeletal: She exhibits no edema or tenderness.  Neurological: She is alert and oriented to person, place, and time.  Skin: Skin is warm and dry. She is not diaphoretic.  Psychiatric: She has a normal mood and affect. Her behavior is normal.  Nursing note and vitals reviewed.    ED Treatments / Results  Labs (all labs ordered are listed, but only abnormal results are displayed) Labs Reviewed  WET PREP, GENITAL - Abnormal; Notable for the following components:      Result Value   Clue Cells Wet Prep HPF POC PRESENT (*)    WBC, Wet Prep HPF POC MANY (*)    All other components within normal limits  URINALYSIS, ROUTINE W REFLEX MICROSCOPIC  PREGNANCY, URINE  RPR  HIV ANTIBODY (ROUTINE TESTING W REFLEX)  GC/CHLAMYDIA PROBE AMP ([place]) NOT AT [facility]    EKG None  Radiology No results found.  Procedures Procedures (including critical care time)  Medications Ordered in ED Medications  cefTRIAXone (ROCEPHIN) injection 250 mg (250 mg Intramuscular Given 08/20/18 0858)  azithromycin (ZITHROMAX) tablet 1,000 mg (1,000 mg Oral Given 08/20/18 0859)  lidocaine (PF) (XYLOCAINE) 1 % injection (1.2 mLs  Given 08/20/18 0859)  ondansetron (ZOFRAN-ODT) disintegrating tablet 4 mg (4 mg Oral Given 08/20/18 0936)     Initial Impression / Assessment and Plan / ED Course  I have reviewed the triage vital signs and the nursing notes.  Pertinent labs & imaging results that were available during my care of the patient were reviewed by me and  considered in my medical decision making (see chart for details).     32 yo F with 6 months of pelvic pain.  Off and on, worsening over the past couple days.  Had some whitish discharge.  My exam with cervical motion tenderness.  Will treat with antibiotics.  Patient is somewhat skeptical that she has an infection as she says she is been tested previously without ideology.  I discussed with this prolonged course of pain she likely needs follow-up with an OB/GYN for further evaluation.  Her urine test is negative for pregnancy, UA clean.  Wet prep with BV.  Some nausea post azithro.  Will  treat with prolonged course due to nausea.  Given local GYN follow up.   9:52 AM:  I have discussed the diagnosis/risks/treatment options with the patient and believe the pt to be eligible for discharge home to follow-up with GYN. We also discussed returning to the ED immediately if new or worsening sx occur. We discussed the sx which are most concerning (e.g., sudden worsening pain, fever, inability to tolerate by mouth) that necessitate immediate return. Medications administered to the patient during their visit and any new prescriptions provided to the patient are listed below.  Medications given during this visit Medications  cefTRIAXone (ROCEPHIN) injection 250 mg (250 mg Intramuscular Given 08/20/18 0858)  azithromycin (ZITHROMAX) tablet 1,000 mg (1,000 mg Oral Given 08/20/18 0859)  lidocaine (PF) (XYLOCAINE) 1 % injection (1.2 mLs  Given 08/20/18 0859)  ondansetron (ZOFRAN-ODT) disintegrating tablet 4 mg (4 mg Oral Given 08/20/18 0936)      The patient appears reasonably screen and/or stabilized for discharge and I doubt any other medical condition or other [facility] requiring further screening, evaluation, or treatment in the ED at this time prior to discharge.    Final Clinical Impressions(s) / ED Diagnoses   Final diagnoses:  Cervicitis  BV (bacterial vaginosis)    ED Discharge Orders         Ordered      doxycycline (VIBRAMYCIN) 100 MG capsule  2 times daily     08/20/18 0939    metroNIDAZOLE (FLAGYL) 500 MG tablet  2 times daily     08/20/18 0940           Melene Plan, DO 08/20/18 [phone]

## 2018-08-20 NOTE — ED Triage Notes (Signed)
Ongoing low abd pain. Recently treated for trich and a UTI, finished abx. Also reports recently had a miscarriage. Pt endorses vaginal spotting, denies discharge.

## 2018-08-20 NOTE — ED Notes (Signed)
ED Provider at bedside. 

## 2018-08-20 NOTE — Discharge Instructions (Signed)
Follow up with your OBGYN.  Return for worsening pain, fever.

## 2018-08-20 NOTE — ED Notes (Signed)
Pt vomiting.

## 2018-08-21 LAB — GC/CHLAMYDIA PROBE AMP (~~LOC~~) NOT AT ARMC
Chlamydia: NEGATIVE
Neisseria Gonorrhea: NEGATIVE

## 2018-08-21 LAB — HIV ANTIBODY (ROUTINE TESTING W REFLEX): HIV Screen 4th Generation wRfx: NONREACTIVE

## 2018-08-21 LAB — RPR: RPR: NONREACTIVE

## 2018-09-02 MED FILL — metroNIDAZOLE 500 MG TABS: 500 | 7 days supply | Qty: 14 | Fill #0

## 2018-10-12 ENCOUNTER — Emergency Department (HOSPITAL_COMMUNITY)
Admission: EM | Admit: 2018-10-12 | Discharge: 2018-10-12 | Discharge status: Absent without leave | Payer: Medicaid Other

## 2018-10-13 ENCOUNTER — Emergency Department (HOSPITAL_BASED_OUTPATIENT_CLINIC_OR_DEPARTMENT_OTHER)
Admission: EM | Admit: 2018-10-13 | Discharge: 2018-10-13 | Disposition: A | Discharge status: Home / Self Care | Payer: Medicaid Other | Attending: Emergency Medicine | Admitting: Emergency Medicine

## 2018-10-13 ENCOUNTER — Encounter (HOSPITAL_BASED_OUTPATIENT_CLINIC_OR_DEPARTMENT_OTHER): Payer: Self-pay | Admitting: Emergency Medicine

## 2018-10-13 ENCOUNTER — Other Ambulatory Visit: Payer: Self-pay

## 2018-10-13 DIAGNOSIS — R6889 Other general symptoms and signs: Secondary | ICD-10-CM

## 2018-10-13 DIAGNOSIS — J111 Influenza due to unidentified influenza virus with other respiratory manifestations: Secondary | ICD-10-CM | POA: Diagnosis present

## 2018-10-13 MED ORDER — ONDANSETRON HCL 4 MG PO TABS: 4.0000 mg | ORAL_TABLET | Freq: Three times a day (TID) | ORAL | 0 refills | Status: DC | PRN

## 2018-10-13 MED ORDER — ONDANSETRON HCL 8 MG PO TABS
4.0000 mg | ORAL_TABLET | Freq: Once | ORAL | Status: AC
  Administered 2018-10-13: 4 mg via ORAL
  Filled 2018-10-13: qty 1

## 2018-10-13 NOTE — ED Provider Notes (Signed)
[facility] EMERGENCY DEPARTMENT Provider Note   CSN: [id number] Arrival date & time: 10/13/18  [id number]  History   Chief Complaint Chief Complaint  Patient presents with  . Flu like symptoms    HPI Melanie Pineda is a 32 y.o. female presenting with 1 day of body aches, fever, cough. Her boyfriend has been sick for the past week with "flu like illness". She felt unwell yesterday and took a nap, woke up with body aches. She took tylenol with relief. She then went back to sleep. This morning her temperature was 100.6 so she came in. She reports nausea and vomiting when attempting to eat or drink. She has some loose stools.   HPI  Past Medical History:  Diagnosis Date  . Anemia   . Bipolar 1 disorder (HCC)   . IBS (irritable bowel syndrome)   . Reflux esophagitis     There are no active problems to display for this patient.   History reviewed. No pertinent surgical history.   OB History    Gravida  3   Para  2   Term  2   Preterm      AB      Living  2     SAB      TAB      Ectopic      Multiple      Live Births  1            Home Medications    Prior to Admission medications   Medication Sig Start Date End Date Taking? Authorizing Provider  busPIRone (BUSPAR) 10 MG tablet Take 10 mg by mouth 2 (two) times daily.    [provider]  Cariprazine HCl (VRAYLAR) 1.5 MG CAPS Take 1.5 mg by mouth daily.     [provider]  clonazePAM (KLONOPIN) 1 MG tablet Take 1 mg by mouth 2 (two) times daily.    [provider]  ondansetron (ZOFRAN) 4 MG tablet Take 1 tablet (4 mg total) by mouth every 8 (eight) hours as needed for nausea or vomiting. 10/13/18   Tillman Sersiccio, Eulamae Greenstein C, DO    Family History No family history on file.  Social History Social History   Tobacco Use  . Smoking status: Former Games developermoker  . Smokeless tobacco: Never Used  Substance Use Topics  . Alcohol use: No  . Drug use: Yes    Types: Marijuana      Allergies   Ultram [tramadol] and Bactrim [sulfamethoxazole-trimethoprim]   Review of Systems Review of Systems  Constitutional: Positive for appetite change, chills and fever. Negative for activity change and diaphoresis.  HENT: Negative for congestion, ear pain and sore throat.   Eyes: Negative for discharge.  Respiratory: Positive for cough. Negative for shortness of breath and wheezing.   Cardiovascular: Negative for chest pain.  Gastrointestinal: Positive for diarrhea, nausea and vomiting. Negative for abdominal pain.  Genitourinary: Negative for decreased urine volume and dysuria.  Musculoskeletal: Positive for myalgias. Negative for neck pain and neck stiffness.  Skin: Negative for rash.  Neurological: Negative for headaches.     Physical Exam Updated Vital Signs BP 106/82 (BP Location: Left Arm)   Pulse 80   Temp 99.8 F (37.7 C) (Oral)   Resp 18   Ht 5\' 6"  (1.676 m)   Wt 70.3 kg   SpO2 99%   BMI 25.02 kg/m   Physical Exam Vitals signs and nursing note reviewed.  Constitutional:      General: She  is not in acute distress.    Appearance: Normal appearance. She is not ill-appearing or toxic-appearing.  HENT:     Head: Normocephalic and atraumatic.     Nose: Nose normal.     Mouth/Throat:     Mouth: Mucous membranes are moist.     Pharynx: No oropharyngeal exudate or posterior oropharyngeal erythema.  Eyes:     Extraocular Movements: Extraocular movements intact.     Pupils: Pupils are equal, round, and reactive to light.  Neck:     Musculoskeletal: Normal range of motion and neck supple. No neck rigidity.  Cardiovascular:     Rate and Rhythm: Normal rate and regular rhythm.     Pulses: Normal pulses.  Pulmonary:     Effort: Pulmonary effort is normal. No respiratory distress.     Breath sounds: Normal breath sounds.  Abdominal:     General: Bowel sounds are normal.     Palpations: Abdomen is soft.     Tenderness: There is no abdominal tenderness.  There is no guarding.  Musculoskeletal: Normal range of motion.        General: No swelling.  Lymphadenopathy:     Cervical: No cervical adenopathy.  Skin:    General: Skin is warm and dry.     Capillary Refill: Capillary refill takes less than 2 seconds.  Neurological:     General: No focal deficit present.     Mental Status: She is alert and oriented to person, place, and time.  Psychiatric:        Mood and Affect: Mood normal.        Behavior: Behavior normal.      ED Treatments / Results  Labs (all labs ordered are listed, but only abnormal results are displayed) Labs Reviewed - No data to display  EKG None  Radiology No results found.  Procedures Procedures (including critical care time)  Medications Ordered in ED Medications  ondansetron (ZOFRAN) tablet 4 mg (has no administration in time range)     Initial Impression / Assessment and Plan / ED Course  I have reviewed the triage vital signs and the nursing notes.  Pertinent labs & imaging results that were available during my care of the patient were reviewed by me and considered in my medical decision making (see chart for details).     32 year old with 1 day of body aches, cough, fever. She is well appearing, afebrile. No focal lung findings, oropharynx clear. Discussed likely virus, possibly flu. Discussed tamiflu and side effects/cost, patient opted to not treat. Discussed supportive care including tylenol, zofran as needed, staying hydrated. Work note given. Stable for discharge home. Reasons to return reviewed including inability to tolerate PO or new/worrisome symptoms. Patient verbalized understanding and agreement with plan.   Final Clinical Impressions(s) / ED Diagnoses   Final diagnoses:  Flu-like symptoms    ED Discharge Orders         Ordered    ondansetron (ZOFRAN) 4 MG tablet  Every 8 hours PRN     10/13/18 1021           Tillman SersRiccio, Keyshon Stein C, DO 10/13/18 1022    Maia PlanLong, Joshua G,  MD 10/13/18 1932

## 2018-10-13 NOTE — Discharge Instructions (Signed)
°  Nice to see you today Please use zofran as needed for nausea/vomiting Continue tylenol for fever or aches. Do not exceed 4 grams of tylenol a day. You can take [phone] mg every 6 hours as needed.

## 2018-10-13 NOTE — ED Triage Notes (Signed)
Cough, fever, body aches for a few days. States boyfriend has the flu.

## 2018-10-29 ENCOUNTER — Other Ambulatory Visit: Payer: Self-pay

## 2018-10-29 ENCOUNTER — Encounter (HOSPITAL_BASED_OUTPATIENT_CLINIC_OR_DEPARTMENT_OTHER): Payer: Self-pay

## 2018-10-29 ENCOUNTER — Emergency Department (HOSPITAL_BASED_OUTPATIENT_CLINIC_OR_DEPARTMENT_OTHER)
Admission: EM | Admit: 2018-10-29 | Discharge: 2018-10-30 | Disposition: A | Discharge status: Home / Self Care | Payer: Medicaid Other | Attending: Emergency Medicine | Admitting: Emergency Medicine

## 2018-10-29 ENCOUNTER — Emergency Department (HOSPITAL_BASED_OUTPATIENT_CLINIC_OR_DEPARTMENT_OTHER): Payer: Medicaid Other

## 2018-10-29 DIAGNOSIS — R102 Pelvic and perineal pain: Secondary | ICD-10-CM | POA: Diagnosis present

## 2018-10-29 DIAGNOSIS — E876 Hypokalemia: Secondary | ICD-10-CM | POA: Insufficient documentation

## 2018-10-29 DIAGNOSIS — Z79899 Other long term (current) drug therapy: Secondary | ICD-10-CM | POA: Insufficient documentation

## 2018-10-29 DIAGNOSIS — Z87891 Personal history of nicotine dependence: Secondary | ICD-10-CM | POA: Insufficient documentation

## 2018-10-29 DIAGNOSIS — N760 Acute vaginitis: Secondary | ICD-10-CM | POA: Insufficient documentation

## 2018-10-29 DIAGNOSIS — B9689 Other specified bacterial agents as the cause of diseases classified elsewhere: Secondary | ICD-10-CM

## 2018-10-29 LAB — CBC WITH DIFFERENTIAL/PLATELET
ABS IMMATURE GRANULOCYTES: 0.02 10*3/uL (ref 0.00–0.07)
BASOS PCT: 1 %
Basophils Absolute: 0 10*3/uL (ref 0.0–0.1)
EOS ABS: 0.3 10*3/uL (ref 0.0–0.5)
Eosinophils Relative: 3 %
HCT: 40.5 % (ref 36.0–46.0)
Hemoglobin: 13.4 g/dL (ref 12.0–15.0)
IMMATURE GRANULOCYTES: 0 %
Lymphocytes Relative: 33 %
Lymphs Abs: 2.9 10*3/uL (ref 0.7–4.0)
MCH: 32.1 pg (ref 26.0–34.0)
MCHC: 33.1 g/dL (ref 30.0–36.0)
MCV: 96.9 fL (ref 80.0–100.0)
MONO ABS: 1 10*3/uL (ref 0.1–1.0)
Monocytes Relative: 11 %
NEUTROS ABS: 4.6 10*3/uL (ref 1.7–7.7)
NEUTROS PCT: 52 %
PLATELETS: 212 10*3/uL (ref 150–400)
RBC: 4.18 MIL/uL (ref 3.87–5.11)
RDW: 11.9 % (ref 11.5–15.5)
WBC: 8.8 10*3/uL (ref 4.0–10.5)
nRBC: 0 % (ref 0.0–0.2)

## 2018-10-29 LAB — WET PREP, GENITAL
Sperm: NONE SEEN
Trich, Wet Prep: NONE SEEN
YEAST WET PREP: NONE SEEN

## 2018-10-29 LAB — BASIC METABOLIC PANEL
ANION GAP: 6 (ref 5–15)
BUN: 12 mg/dL (ref 6–20)
CO2: 25 mmol/L (ref 22–32)
Calcium: 8.9 mg/dL (ref 8.9–10.3)
Chloride: 102 mmol/L (ref 98–111)
Creatinine, Ser: 0.9 mg/dL (ref 0.44–1.00)
GFR calc Af Amer: >60 mL/min (ref >60)
GFR calc non Af Amer: >60 mL/min (ref >60)
Glucose, Bld: 71 mg/dL (ref 70–99)
POTASSIUM: 2.8 mmol/L — AB (ref 3.5–5.1)
SODIUM: 133 mmol/L — AB (ref 135–145)

## 2018-10-29 LAB — URINALYSIS, MICROSCOPIC (REFLEX): RBC / HPF: NONE SEEN RBC/hpf (ref 0–5)

## 2018-10-29 LAB — PREGNANCY, URINE: Preg Test, Ur: NEGATIVE

## 2018-10-29 LAB — URINALYSIS, ROUTINE W REFLEX MICROSCOPIC
Bilirubin Urine: NEGATIVE
GLUCOSE, UA: NEGATIVE mg/dL
Hgb urine dipstick: NEGATIVE
KETONES UR: NEGATIVE mg/dL
Nitrite: NEGATIVE
PH: 6.5 (ref 5.0–8.0)
Protein, ur: NEGATIVE mg/dL
SPECIFIC GRAVITY, URINE: 1.01 (ref 1.005–1.030)

## 2018-10-29 MED ORDER — POTASSIUM CHLORIDE CRYS ER 20 MEQ PO TBCR
60.0000 meq | EXTENDED_RELEASE_TABLET | Freq: Once | ORAL | Status: AC
  Administered 2018-10-29: 60 meq via ORAL
  Filled 2018-10-29: qty 3

## 2018-10-29 MED ORDER — CLINDAMYCIN PHOSPHATE 100 MG VA SUPP: 100.0000 mg | Freq: Every day | VAGINAL | 0 refills | Status: DC

## 2018-10-29 MED ORDER — ONDANSETRON 8 MG PO TBDP
8.0000 mg | ORAL_TABLET | Freq: Once | ORAL | Status: AC
  Administered 2018-10-29: 8 mg via ORAL
  Filled 2018-10-29: qty 1

## 2018-10-29 NOTE — ED Provider Notes (Signed)
[facility] EMERGENCY DEPARTMENT Provider Note   CSN: [id number] Arrival date & time: 10/29/18  2026     History   Chief Complaint Chief Complaint  Patient presents with  . Abdominal Pain    HPI Melanie Pineda is a 33 y.o. female.  33 year old female presents with complaint of pelvic pain with vaginal odor, x2 days.  Patient was seen at her GYN office yesterday for same, diagnosed with urinary tract infection started on Macrobid.  Patient states Macrobid is not helping, states her pain is getting worse, reports nausea with 1 episode of emesis today while in the lobby.  Patient denies fevers or chills.  Patient is sexually active, denies any new partners however is unsure if she Derasmo have an STD.  Patient states that she gets BV every month and feels like this is the same however pain is worse today.  No other complaints or concerns.     Past Medical History:  Diagnosis Date  . Anemia   . Bipolar 1 disorder (HCC)   . IBS (irritable bowel syndrome)   . Reflux esophagitis     There are no active problems to display for this patient.   History reviewed. No pertinent surgical history.   OB History    Gravida  3   Para  2   Term  2   Preterm      AB      Living  2     SAB      TAB      Ectopic      Multiple      Live Births  1            Home Medications    Prior to Admission medications   Medication Sig Start Date End Date Taking? Authorizing Provider  busPIRone (BUSPAR) 10 MG tablet Take 10 mg by mouth 2 (two) times daily.    [provider]  Cariprazine HCl (VRAYLAR) 1.5 MG CAPS Take 1.5 mg by mouth daily.     [provider]  clindamycin (CLEOCIN) 100 MG vaginal suppository Place 1 suppository (100 mg total) vaginally at bedtime. 10/29/18   Jeannie Fend, PA-C  clonazePAM (KLONOPIN) 1 MG tablet Take 1 mg by mouth 2 (two) times daily.    [provider]  ondansetron (ZOFRAN) 4 MG tablet Take 1 tablet (4 mg total)  by mouth every 8 (eight) hours as needed for nausea or vomiting. 10/13/18   Tillman Sers, DO    Family History History reviewed. No pertinent family history.  Social History Social History   Tobacco Use  . Smoking status: Former Games developer  . Smokeless tobacco: Never Used  Substance Use Topics  . Alcohol use: No  . Drug use: Yes    Types: Marijuana     Allergies   Ultram [tramadol] and Bactrim [sulfamethoxazole-trimethoprim]   Review of Systems Review of Systems  Constitutional: Negative for chills and fever.  Gastrointestinal: Positive for abdominal pain, nausea and vomiting. Negative for constipation and diarrhea.  Genitourinary: Positive for pelvic pain. Negative for dysuria, frequency and vaginal discharge.  Musculoskeletal: Negative for back pain.  Skin: Negative for rash and wound.  Allergic/Immunologic: Negative for immunocompromised state.  Hematological: Negative for adenopathy.  Psychiatric/Behavioral: Negative for confusion.  All other systems reviewed and are negative.    Physical Exam Updated Vital Signs BP 97/65 (BP Location: Right Arm)   Pulse 71   Temp 98.2 F (36.8 C) (Oral)   Resp 20  Ht 5\' 6"  (1.676 m)   Wt 71.7 kg   LMP 09/30/2018   SpO2 97%   BMI 25.50 kg/m   Physical Exam Vitals signs and nursing note reviewed. Exam conducted with a chaperone present.  Constitutional:      General: She is not in acute distress.    Appearance: She is well-developed. She is not diaphoretic.  HENT:     Head: Normocephalic and atraumatic.  Cardiovascular:     Rate and Rhythm: Normal rate and regular rhythm.     Heart sounds: Normal heart sounds. No murmur.  Pulmonary:     Effort: Pulmonary effort is normal.  Abdominal:     Palpations: Abdomen is soft.     Tenderness: There is abdominal tenderness in the right lower quadrant, suprapubic area and left lower quadrant. There is no right CVA tenderness or left CVA tenderness.  Genitourinary:    Vagina:  Vaginal discharge present.     Cervix: No cervical motion tenderness or friability.     Uterus: Tender.      Adnexa:        Right: Tenderness present. No mass.         Left: Tenderness present. No mass.       Comments: This white discharge present on cervix Skin:    General: Skin is warm and dry.  Neurological:     Mental Status: She is alert and oriented to person, place, and time.  Psychiatric:        Behavior: Behavior normal.      ED Treatments / Results  Labs (all labs ordered are listed, but only abnormal results are displayed) Labs Reviewed  WET PREP, GENITAL - Abnormal; Notable for the following components:      Result Value   Clue Cells Wet Prep HPF POC PRESENT (*)    WBC, Wet Prep HPF POC FEW (*)    All other components within normal limits  BASIC METABOLIC PANEL - Abnormal; Notable for the following components:   Sodium 133 (*)    Potassium 2.8 (*)    All other components within normal limits  URINALYSIS, ROUTINE W REFLEX MICROSCOPIC - Abnormal; Notable for the following components:   Leukocytes, UA TRACE (*)    All other components within normal limits  URINALYSIS, MICROSCOPIC (REFLEX) - Abnormal; Notable for the following components:   Bacteria, UA RARE (*)    All other components within normal limits  CBC WITH DIFFERENTIAL/PLATELET  PREGNANCY, URINE  GC/CHLAMYDIA PROBE AMP ([place]) NOT AT [facility]    EKG None  Radiology US Transvaginal Non-ob  Result Date: 10/29/2018 CLINICAL DATA:  Initial evaluation for acute onset left lower quadrant pain. EXAM: TRANSABDOMINAL AND TRANSVAGINAL ULTRASOUND OF PELVIS DOPPLER ULTRASOUND OF OVARIES TECHNIQUE: Both transabdominal and transvaginal ultrasound examinations of the pelvis were performed. Transabdominal technique was performed for global imaging of the pelvis including uterus, ovaries, adnexal regions, and pelvic cul-de-sac. It was necessary to proceed with endovaginal exam following the transabdominal exam to  visualize the uterus, endometrium, and ovaries. Color and duplex Doppler ultrasound was utilized to evaluate blood flow to the ovaries. COMPARISON:  Prior ultrasound from 07/17/2018. FINDINGS: Uterus Measurements: 9.4 x 4.8 x 5.9 cm = volume: 139 mL. No fibroids or other mass visualized. Endometrium Thickness: 8 mm.  No focal abnormality visualized. Right ovary Measurements: 3.3 x 2.5 x 2.4 cm = volume: 10 mL. Normal appearance/no adnexal mass. Left ovary Measurements: 3.6 x 2.6 x 3.0 cm = volume: 14.3 mL. There is  a probable small left ovarian corpus luteal cyst measuring approximately 15 mm in size. No other abnormality or adnexal mass. Pulsed Doppler evaluation of both ovaries demonstrates normal low-resistance arterial and venous waveforms. Other findings No abnormal free fluid. IMPRESSION: 1. Probable 15 mm left ovarian corpus luteal cyst, which could contribute to acute left lower quadrant pain. 2. Otherwise unremarkable and normal pelvic ultrasound. No evidence for ovarian torsion. Electronically Signed   By: Rise MuBenjamin  McClintock M.D.   On: 10/29/2018 23:17   [place] Pelvis Complete  Result Date: 10/29/2018 CLINICAL DATA:  Initial evaluation for acute onset left lower quadrant pain. EXAM: TRANSABDOMINAL AND TRANSVAGINAL ULTRASOUND OF PELVIS DOPPLER ULTRASOUND OF OVARIES TECHNIQUE: Both transabdominal and transvaginal ultrasound examinations of the pelvis were performed. Transabdominal technique was performed for global imaging of the pelvis including uterus, ovaries, adnexal regions, and pelvic cul-de-sac. It was necessary to proceed with endovaginal exam following the transabdominal exam to visualize the uterus, endometrium, and ovaries. Color and duplex Doppler ultrasound was utilized to evaluate blood flow to the ovaries. COMPARISON:  Prior ultrasound from 07/17/2018. FINDINGS: Uterus Measurements: 9.4 x 4.8 x 5.9 cm = volume: 139 mL. No fibroids or other mass visualized. Endometrium Thickness: 8 mm.  No  focal abnormality visualized. Right ovary Measurements: 3.3 x 2.5 x 2.4 cm = volume: 10 mL. Normal appearance/no adnexal mass. Left ovary Measurements: 3.6 x 2.6 x 3.0 cm = volume: 14.3 mL. There is a probable small left ovarian corpus luteal cyst measuring approximately 15 mm in size. No other abnormality or adnexal mass. Pulsed Doppler evaluation of both ovaries demonstrates normal low-resistance arterial and venous waveforms. Other findings No abnormal free fluid. IMPRESSION: 1. Probable 15 mm left ovarian corpus luteal cyst, which could contribute to acute left lower quadrant pain. 2. Otherwise unremarkable and normal pelvic ultrasound. No evidence for ovarian torsion. Electronically Signed   By: Rise MuBenjamin  McClintock M.D.   On: 10/29/2018 23:17   [place] Art/ven Flow Abd Pelv Doppler  Result Date: 10/29/2018 CLINICAL DATA:  Initial evaluation for acute onset left lower quadrant pain. EXAM: TRANSABDOMINAL AND TRANSVAGINAL ULTRASOUND OF PELVIS DOPPLER ULTRASOUND OF OVARIES TECHNIQUE: Both transabdominal and transvaginal ultrasound examinations of the pelvis were performed. Transabdominal technique was performed for global imaging of the pelvis including uterus, ovaries, adnexal regions, and pelvic cul-de-sac. It was necessary to proceed with endovaginal exam following the transabdominal exam to visualize the uterus, endometrium, and ovaries. Color and duplex Doppler ultrasound was utilized to evaluate blood flow to the ovaries. COMPARISON:  Prior ultrasound from 07/17/2018. FINDINGS: Uterus Measurements: 9.4 x 4.8 x 5.9 cm = volume: 139 mL. No fibroids or other mass visualized. Endometrium Thickness: 8 mm.  No focal abnormality visualized. Right ovary Measurements: 3.3 x 2.5 x 2.4 cm = volume: 10 mL. Normal appearance/no adnexal mass. Left ovary Measurements: 3.6 x 2.6 x 3.0 cm = volume: 14.3 mL. There is a probable small left ovarian corpus luteal cyst measuring approximately 15 mm in size. No other abnormality or  adnexal mass. Pulsed Doppler evaluation of both ovaries demonstrates normal low-resistance arterial and venous waveforms. Other findings No abnormal free fluid. IMPRESSION: 1. Probable 15 mm left ovarian corpus luteal cyst, which could contribute to acute left lower quadrant pain. 2. Otherwise unremarkable and normal pelvic ultrasound. No evidence for ovarian torsion. Electronically Signed   By: Rise MuBenjamin  McClintock M.D.   On: 10/29/2018 23:17    Procedures Procedures (including critical care time)  Medications Ordered in ED Medications  ondansetron (ZOFRAN-ODT) disintegrating tablet 8  mg (8 mg Oral Given 10/29/18 2218)  potassium chloride SA (K-DUR,KLOR-CON) CR tablet 60 mEq (60 mEq Oral Given 10/29/18 2218)     Initial Impression / Assessment and Plan / ED Course  I have reviewed the triage vital signs and the nursing notes.  Pertinent labs & imaging results that were available during my care of the patient were reviewed by me and considered in my medical decision making (see chart for details).  Clinical Course as of Oct 29 2333  Tue Oct 29, 2018  2306 33 yo female with pelvic pain, vaginal odor, feeling unwell, not improving with Macrobid given by her GYN yesterday. Patient with several prior ER complaints of pelvic pain, negative gonorrhea/chlamydia testing (last test 08/20/2018). Presentation today is similar to prior workups in the ER.    [LM]  2322 Of labs, CBC is unremarkable, wet prep is positive for clue cells, patient's BMP shows hypokalemia with a potassium of 2.8 which was orally repleted while in the emergency room.  Patient has one episode of vomiting while in the ER, given Zofran and no further episodes of emesis.  Recommend increase potassium in her daily diet and follow-up with her PCP for recheck.   [LM]  2333 Urine pregnancy test is negative, urinalysis positive for leukocytes.   [LM]    Clinical Course User Index [LM] Jeannie Fend, PA-C   Final Clinical  Impressions(s) / ED Diagnoses   Final diagnoses:  Pelvic pain in female  Hypokalemia  Bacterial vaginosis    ED Discharge Orders         Ordered    clindamycin (CLEOCIN) 100 MG vaginal suppository  Daily at bedtime     10/29/18 2319           Jeannie Fend, PA-C 10/29/18 1191    Rolan Bucco, MD 11/01/18 1458

## 2018-10-29 NOTE — ED Notes (Signed)
PT states understanding of care given, follow up care, and medication prescribed. PT ambulated from ED to car with a steady gait. 

## 2018-10-29 NOTE — Discharge Instructions (Signed)
Apply Clindamycin nightly for 7 days. Follow up with your GYN.

## 2018-10-29 NOTE — ED Triage Notes (Signed)
Pt states on Sunday pt started to have foul odor in the vaginal area pt called obgyn and stated to have some thing in her urine and pt was started on antibiotics, pt states today started to have lower left abdominal pain. With some foul odor still.

## 2018-10-30 LAB — GC/CHLAMYDIA PROBE AMP (~~LOC~~) NOT AT ARMC
Chlamydia: NEGATIVE
Neisseria Gonorrhea: NEGATIVE

## 2018-12-23 ENCOUNTER — Encounter (HOSPITAL_BASED_OUTPATIENT_CLINIC_OR_DEPARTMENT_OTHER): Payer: Self-pay | Admitting: Emergency Medicine

## 2018-12-23 ENCOUNTER — Other Ambulatory Visit: Payer: Self-pay

## 2018-12-23 ENCOUNTER — Emergency Department (HOSPITAL_BASED_OUTPATIENT_CLINIC_OR_DEPARTMENT_OTHER)
Admission: EM | Admit: 2018-12-23 | Discharge: 2018-12-23 | Disposition: A | Discharge status: Home / Self Care | Payer: Medicaid Other | Attending: Emergency Medicine | Admitting: Emergency Medicine

## 2018-12-23 DIAGNOSIS — A599 Trichomoniasis, unspecified: Secondary | ICD-10-CM | POA: Insufficient documentation

## 2018-12-23 DIAGNOSIS — Z87891 Personal history of nicotine dependence: Secondary | ICD-10-CM | POA: Diagnosis not present

## 2018-12-23 DIAGNOSIS — F1210 Cannabis abuse, uncomplicated: Secondary | ICD-10-CM | POA: Diagnosis not present

## 2018-12-23 DIAGNOSIS — R3911 Hesitancy of micturition: Secondary | ICD-10-CM | POA: Diagnosis not present

## 2018-12-23 DIAGNOSIS — R300 Dysuria: Secondary | ICD-10-CM | POA: Diagnosis present

## 2018-12-23 DIAGNOSIS — R3915 Urgency of urination: Secondary | ICD-10-CM | POA: Insufficient documentation

## 2018-12-23 DIAGNOSIS — N3001 Acute cystitis with hematuria: Secondary | ICD-10-CM | POA: Diagnosis not present

## 2018-12-23 DIAGNOSIS — R1030 Lower abdominal pain, unspecified: Secondary | ICD-10-CM | POA: Insufficient documentation

## 2018-12-23 LAB — URINALYSIS, ROUTINE W REFLEX MICROSCOPIC
Bilirubin Urine: NEGATIVE
Glucose, UA: NEGATIVE mg/dL
Ketones, ur: NEGATIVE mg/dL
Nitrite: POSITIVE — AB
Protein, ur: 30 mg/dL — AB
Specific Gravity, Urine: >1.03 — ABNORMAL HIGH (ref 1.005–1.030)
pH: 6 (ref 5.0–8.0)

## 2018-12-23 LAB — URINALYSIS, MICROSCOPIC (REFLEX): WBC, UA: >50 WBC/hpf (ref 0–5)

## 2018-12-23 LAB — WET PREP, GENITAL
Sperm: NONE SEEN
Yeast Wet Prep HPF POC: NONE SEEN

## 2018-12-23 LAB — PREGNANCY, URINE: Preg Test, Ur: NEGATIVE

## 2018-12-23 MED ORDER — METRONIDAZOLE 500 MG PO TABS
2000.0000 mg | ORAL_TABLET | Freq: Once | ORAL | Status: AC
  Administered 2018-12-23: 2000 mg via ORAL
  Filled 2018-12-23: qty 4

## 2018-12-23 MED ORDER — CEPHALEXIN 500 MG PO CAPS: 500.0000 mg | ORAL_CAPSULE | Freq: Four times a day (QID) | ORAL | 0 refills | Status: AC

## 2018-12-23 MED ORDER — FLUCONAZOLE 200 MG PO TABS: 200.0000 mg | ORAL_TABLET | Freq: Every day | ORAL | 0 refills | Status: AC | PRN

## 2018-12-23 NOTE — ED Triage Notes (Signed)
Pt reports dysuria and difficulty urinating started today , lower abd discomfort. Was treated for UTI , vaginosis 2 months ago.

## 2018-12-23 NOTE — ED Provider Notes (Signed)
Emergency Department Provider Note   I have reviewed the triage vital signs and the nursing notes.   HISTORY  Chief Complaint Dysuria   HPI Melanie Pineda is a 33 y.o. female with PMH of Bipolar and iBS presents to the emergency department for evaluation of dysuria, hesitancy, urgency.  Symptoms began today along with lower abdominal discomfort.  Patient states that 2 months ago she was treated for both a UTI and bacterial vaginosis and that this feels similar.  She is not experiencing significant vaginal discharge.  She is on the Depo shot had some vaginal bleeding last week but that has resolved.  No concern for STD.  She denies any back or flank pain no flulike symptoms or fevers.   Past Medical History:  Diagnosis Date  . Anemia   . Bipolar 1 disorder (HCC)   . IBS (irritable bowel syndrome)   . Reflux esophagitis     There are no active problems to display for this patient.   History reviewed. No pertinent surgical history.  Allergies Ultram [tramadol] and Bactrim [sulfamethoxazole-trimethoprim]  No family history on file.  Social History Social History   Tobacco Use  . Smoking status: Former Games developer  . Smokeless tobacco: Never Used  Substance Use Topics  . Alcohol use: No  . Drug use: Yes    Types: Marijuana    Review of Systems  Constitutional: No fever/chills Eyes: No visual changes. ENT: No sore throat. Cardiovascular: Denies chest pain. Respiratory: Denies shortness of breath. Gastrointestinal: No abdominal pain.  No nausea, no vomiting.  No diarrhea.  No constipation. Genitourinary: Positive for dysuria, hesitancy, and urgency. Positive vaginal discharge.  Musculoskeletal: Negative for back pain. Skin: Negative for rash. Neurological: Negative for headaches, focal weakness or numbness.  10-point ROS otherwise negative.  ____________________________________________   PHYSICAL EXAM:  VITAL SIGNS: Vitals:   12/23/18 1007 12/23/18 1136  BP:  98/73 101/66  Pulse: 67 68  Resp: 16 18  Temp: 98.3 F (36.8 C) 98.2 F (36.8 C)  SpO2: 99% 100%    Constitutional: Alert and oriented. Well appearing and in no acute distress. Eyes: Conjunctivae are normal.  Head: Atraumatic. Nose: No congestion/rhinnorhea. Mouth/Throat: Mucous membranes are moist.   Neck: No stridor. Cardiovascular: Normal rate, regular rhythm. Good peripheral circulation. Grossly normal heart sounds.   Respiratory: Normal respiratory effort.  No retractions. Lungs CTAB. Gastrointestinal: Soft with mild LLQ abdominal tenderness. No rebound or guarding. No distention.  Genitourinary: Exam performed after verbal consent and with nurse chaperone. No external findings. Mild discharge. No CMT or adnexal fullness/tenderness on exam.  Musculoskeletal: No lower extremity tenderness nor edema. No gross deformities of extremities. Neurologic:  Normal speech and language. No gross focal neurologic deficits are appreciated.  Skin:  Skin is warm, dry and intact. No rash noted.  ____________________________________________   LABS (all labs ordered are listed, but only abnormal results are displayed)  Labs Reviewed  WET PREP, GENITAL - Abnormal; Notable for the following components:      Result Value   Trich, Wet Prep PRESENT (*)    Clue Cells Wet Prep HPF POC PRESENT (*)    WBC, Wet Prep HPF POC MODERATE (*)    All other components within normal limits  URINALYSIS, ROUTINE W REFLEX MICROSCOPIC - Abnormal; Notable for the following components:   Specific Gravity, Urine >1.030 (*)    Hgb urine dipstick LARGE (*)    Protein, ur 30 (*)    Nitrite POSITIVE (*)    Leukocytes,Ua MODERATE (*)  All other components within normal limits  URINALYSIS, MICROSCOPIC (REFLEX) - Abnormal; Notable for the following components:   Bacteria, UA MANY (*)    All other components within normal limits  URINE CULTURE  PREGNANCY, URINE  GC/CHLAMYDIA PROBE AMP ([place]) NOT AT [facility]    ____________________________________________  RADIOLOGY  None ____________________________________________   PROCEDURES  Procedure(s) performed:   Procedures  None ____________________________________________   INITIAL IMPRESSION / ASSESSMENT AND PLAN / ED COURSE  Pertinent labs & imaging results that were available during my care of the patient were reviewed by me and considered in my medical decision making (see chart for details).  Patient presents to the emergency department with UTI symptoms.  No evidence of pyelonephritis either by history or on exam.  I performed a pelvic exam with chaperone which did not demonstrate cervical motion tenderness, adnexal fullness or discomfort.  No concern for TOA or torsion.  UA and wet prep are pending. Not concerning for PID.   UA concerning for UTI but also trich positive. Patient does have significant UTI symptoms. Plan for Flagyl here and patient would prefer to follow up gonorrhea/chlamydia testing and return for treatment PRN. Will cover UTI with meds at home. Discussed disulfram reaction with flagyl. Discussed PCP follow up plan, treatment of sexual partners, and ED return precautions.  ____________________________________________  FINAL CLINICAL IMPRESSION(S) / ED DIAGNOSES  Final diagnoses:  Trichimoniasis  Acute cystitis with hematuria     MEDICATIONS GIVEN DURING THIS VISIT:  Medications  metroNIDAZOLE (FLAGYL) tablet 2,000 mg (2,000 mg Oral Given 12/23/18 1055)     NEW OUTPATIENT MEDICATIONS STARTED DURING THIS VISIT:  Discharge Medication List as of 12/23/2018 10:59 AM    START taking these medications   Details  cephALEXin (KEFLEX) 500 MG capsule Take 1 capsule (500 mg total) by mouth 4 (four) times daily for 7 days., Starting Mon 12/23/2018, Until Mon 12/30/2018, Print    fluconazole (DIFLUCAN) 200 MG tablet Take 1 tablet (200 mg total) by mouth daily as needed for up to 2 days (if yeast infection symptoms  develop)., Starting Mon 12/23/2018, Until Wed 12/25/2018, Print        Note:  This document was prepared using Dragon voice recognition software and [place] include unintentional dictation errors.  Alona Bene, MD Emergency Medicine    Margaretha Mahan, Arlyss Repress, MD 12/23/18 2023

## 2018-12-23 NOTE — Discharge Instructions (Signed)

## 2018-12-24 LAB — GC/CHLAMYDIA PROBE AMP (~~LOC~~) NOT AT ARMC
Chlamydia: POSITIVE — AB
NEISSERIA GONORRHEA: NEGATIVE

## 2018-12-25 ENCOUNTER — Telehealth: Payer: Self-pay | Admitting: Student

## 2018-12-25 DIAGNOSIS — A749 Chlamydial infection, unspecified: Secondary | ICD-10-CM

## 2018-12-25 LAB — URINE CULTURE
Culture: >=100000 — AB
Special Requests: NORMAL

## 2018-12-25 MED ORDER — AZITHROMYCIN 500 MG PO TABS: 1000.0000 mg | ORAL_TABLET | Freq: Once | ORAL | 0 refills | Status: DC

## 2018-12-25 NOTE — Telephone Encounter (Addendum)
Melanie Pineda tested positive for  Chlamydia. Patient was called by RN and allergies and pharmacy confirmed. Rx sent to pharmacy of choice.   Judeth Horn, NP 12/25/2018 1:11 PM       ----- Message from Kathe Becton, RN sent at 12/24/2018  3:40 PM EDT ----- This patient tested positive for :  chlamydia  She ,"is allergic to Bactrim & Ultram",  I have informed the patient of her results and confirmed her pharmacy is correct in her chart. Please send Rx.   Thank you,   Kathe Becton, RN   Results faxed to [facility] Department.

## 2018-12-26 ENCOUNTER — Other Ambulatory Visit: Payer: Self-pay | Admitting: Student

## 2018-12-26 ENCOUNTER — Telehealth: Payer: Self-pay

## 2018-12-26 ENCOUNTER — Telehealth (HOSPITAL_BASED_OUTPATIENT_CLINIC_OR_DEPARTMENT_OTHER): Payer: Self-pay | Admitting: Emergency Medicine

## 2018-12-26 DIAGNOSIS — A749 Chlamydial infection, unspecified: Secondary | ICD-10-CM

## 2018-12-26 NOTE — Telephone Encounter (Signed)
Post ED Visit - Positive Culture Follow-up  Culture report reviewed by antimicrobial stewardship pharmacist: [facility] []  Enzo Bi, Pharm.D. []  Celedonio Miyamoto, Pharm.D., BCPS AQ-ID []  Garvin Fila, Pharm.D., BCPS []  Georgina Pillion, [address].D., BCPS []  [place], [address].D., BCPS, AAHIVP []  Estella Husk, Pharm.D., BCPS, AAHIVP []  Lysle Pearl, PharmD, BCPS []  Phillips Climes, PharmD, BCPS [x]  Agapito Games, PharmD, BCPS []  Verlan Friends, PharmD []  Mervyn Gay, PharmD, BCPS []  Vinnie Level, PharmD  [facility] []  Len Childs, PharmD []  Greer Pickerel, PharmD []  Adalberto Cole, PharmD []  Perlie Gold, Rph []  Lonell Face) Jean Rosenthal, PharmD []  Earl Many, PharmD []  Junita Push, PharmD []  Dorna Leitz, PharmD []  Terrilee Files, PharmD []  Lynann Beaver, PharmD []  Keturah Barre, PharmD []  Loralee Pacas, PharmD []  Bernadene Person, PharmD   Positive urine culture Treated with Cephalexin, organism sensitive to the same and no further patient follow-up is required at this time.  Jerry Caras 12/26/2018, 4:21 PM

## 2020-02-03 ENCOUNTER — Other Ambulatory Visit: Payer: Self-pay

## 2020-02-03 ENCOUNTER — Encounter (HOSPITAL_BASED_OUTPATIENT_CLINIC_OR_DEPARTMENT_OTHER): Payer: Self-pay | Admitting: *Deleted

## 2020-02-03 ENCOUNTER — Emergency Department (HOSPITAL_BASED_OUTPATIENT_CLINIC_OR_DEPARTMENT_OTHER)
Admission: EM | Admit: 2020-02-03 | Discharge: 2020-02-03 | Disposition: A | Discharge status: Home / Self Care | Payer: Medicaid Other | Attending: Emergency Medicine | Admitting: Emergency Medicine

## 2020-02-03 DIAGNOSIS — Z5321 Procedure and treatment not carried out due to patient leaving prior to being seen by health care provider: Secondary | ICD-10-CM | POA: Insufficient documentation

## 2020-02-03 DIAGNOSIS — M7918 Myalgia, other site: Secondary | ICD-10-CM | POA: Diagnosis not present

## 2020-02-03 NOTE — ED Triage Notes (Addendum)
Pt reports that she was seen at [facility] today and was told that she was dehydrated. Denies N/V today. States that she recently finished a round of antibiotics for BV and has had diarrhea since that time.   Pt reports smoking weed earlier yesterday but states that she feels 'discombobulated' when she does so she has not smoked any today. States that she is unable to get 'high' from the weed.  Pt is under the care of a GYN for continues vaginal bleeding x 3 months, is taking progesterone for the bleeding without relief.

## 2020-06-02 ENCOUNTER — Other Ambulatory Visit: Payer: Self-pay

## 2020-06-02 ENCOUNTER — Emergency Department (HOSPITAL_BASED_OUTPATIENT_CLINIC_OR_DEPARTMENT_OTHER): Payer: Medicaid Other

## 2020-06-02 ENCOUNTER — Encounter (HOSPITAL_BASED_OUTPATIENT_CLINIC_OR_DEPARTMENT_OTHER): Payer: Self-pay

## 2020-06-02 ENCOUNTER — Emergency Department (HOSPITAL_BASED_OUTPATIENT_CLINIC_OR_DEPARTMENT_OTHER)
Admission: EM | Admit: 2020-06-02 | Discharge: 2020-06-02 | Disposition: A | Discharge status: Home / Self Care | Payer: Medicaid Other | Attending: Emergency Medicine | Admitting: Emergency Medicine

## 2020-06-02 DIAGNOSIS — Z5321 Procedure and treatment not carried out due to patient leaving prior to being seen by health care provider: Secondary | ICD-10-CM | POA: Insufficient documentation

## 2020-06-02 DIAGNOSIS — R079 Chest pain, unspecified: Secondary | ICD-10-CM | POA: Insufficient documentation

## 2020-06-02 LAB — PREGNANCY, URINE: Preg Test, Ur: NEGATIVE

## 2020-06-02 LAB — BASIC METABOLIC PANEL
Anion gap: 8 (ref 5–15)
BUN: 10 mg/dL (ref 6–20)
CO2: 27 mmol/L (ref 22–32)
Calcium: 9 mg/dL (ref 8.9–10.3)
Chloride: 103 mmol/L (ref 98–111)
Creatinine, Ser: 0.68 mg/dL (ref 0.44–1.00)
GFR calc Af Amer: >60 mL/min (ref >60)
GFR calc non Af Amer: >60 mL/min (ref >60)
Glucose, Bld: 90 mg/dL (ref 70–99)
Potassium: 3.5 mmol/L (ref 3.5–5.1)
Sodium: 138 mmol/L (ref 135–145)

## 2020-06-02 LAB — CBC
HCT: 39.3 % (ref 36.0–46.0)
Hemoglobin: 13.6 g/dL (ref 12.0–15.0)
MCH: 33.3 pg (ref 26.0–34.0)
MCHC: 34.6 g/dL (ref 30.0–36.0)
MCV: 96.1 fL (ref 80.0–100.0)
Platelets: 219 10*3/uL (ref 150–400)
RBC: 4.09 MIL/uL (ref 3.87–5.11)
RDW: 11.9 % (ref 11.5–15.5)
WBC: 6.4 10*3/uL (ref 4.0–10.5)
nRBC: 0 % (ref 0.0–0.2)

## 2020-06-02 LAB — TROPONIN I (HIGH SENSITIVITY): Troponin I (High Sensitivity): 3 ng/L (ref <18)

## 2020-06-02 NOTE — ED Triage Notes (Addendum)
Pt c/o CP x 3 days-denies fever/flu sx-NAD-steady gait

## 2020-06-02 NOTE — ED Notes (Signed)
No answer for treatment room. 

## 2020-06-03 ENCOUNTER — Encounter (HOSPITAL_BASED_OUTPATIENT_CLINIC_OR_DEPARTMENT_OTHER): Payer: Self-pay | Admitting: *Deleted

## 2020-06-03 ENCOUNTER — Emergency Department (HOSPITAL_BASED_OUTPATIENT_CLINIC_OR_DEPARTMENT_OTHER)
Admission: EM | Admit: 2020-06-03 | Discharge: 2020-06-03 | Disposition: A | Discharge status: Home / Self Care | Payer: Medicaid Other | Attending: Emergency Medicine | Admitting: Emergency Medicine

## 2020-06-03 DIAGNOSIS — R0602 Shortness of breath: Secondary | ICD-10-CM | POA: Insufficient documentation

## 2020-06-03 DIAGNOSIS — R072 Precordial pain: Secondary | ICD-10-CM | POA: Insufficient documentation

## 2020-06-03 DIAGNOSIS — R110 Nausea: Secondary | ICD-10-CM | POA: Insufficient documentation

## 2020-06-03 DIAGNOSIS — Z20822 Contact with and (suspected) exposure to covid-19: Secondary | ICD-10-CM | POA: Diagnosis not present

## 2020-06-03 LAB — HEPATIC FUNCTION PANEL
ALT: 13 U/L (ref 0–44)
AST: 17 U/L (ref 15–41)
Albumin: 3.8 g/dL (ref 3.5–5.0)
Alkaline Phosphatase: 37 U/L — ABNORMAL LOW (ref 38–126)
Bilirubin, Direct: 0.1 mg/dL (ref 0.0–0.2)
Indirect Bilirubin: 0.6 mg/dL (ref 0.3–0.9)
Total Bilirubin: 0.7 mg/dL (ref 0.3–1.2)
Total Protein: 6.9 g/dL (ref 6.5–8.1)

## 2020-06-03 LAB — LIPASE, BLOOD: Lipase: 25 U/L (ref 11–51)

## 2020-06-03 LAB — SARS CORONAVIRUS 2 BY RT PCR (HOSPITAL ORDER, PERFORMED IN ~~LOC~~ HOSPITAL LAB): SARS Coronavirus 2: NEGATIVE

## 2020-06-03 LAB — TROPONIN I (HIGH SENSITIVITY): Troponin I (High Sensitivity): 3 ng/L (ref <18)

## 2020-06-03 LAB — D-DIMER, QUANTITATIVE: D-Dimer, Quant: <0.27 ug/mL-FEU (ref 0.00–0.50)

## 2020-06-03 MED ORDER — ONDANSETRON 4 MG PO TBDP: 4.0000 mg | ORAL_TABLET | Freq: Three times a day (TID) | ORAL | 0 refills | Status: DC | PRN

## 2020-06-03 MED ORDER — ONDANSETRON HCL 4 MG/2ML IJ SOLN
4.0000 mg | Freq: Once | INTRAMUSCULAR | Status: AC
  Administered 2020-06-03: 4 mg via INTRAVENOUS
  Filled 2020-06-03: qty 2

## 2020-06-03 MED ORDER — ALUM & MAG HYDROXIDE-SIMETH 200-200-20 MG/5ML PO SUSP
15.0000 mL | Freq: Once | ORAL | Status: AC
  Administered 2020-06-03: 15 mL via ORAL
  Filled 2020-06-03: qty 30

## 2020-06-03 MED ORDER — SUCRALFATE 1 G PO TABS: 1.0000 g | ORAL_TABLET | Freq: Three times a day (TID) | ORAL | 0 refills | Status: AC

## 2020-06-03 MED ORDER — PANTOPRAZOLE SODIUM 40 MG PO TBEC: 40.0000 mg | DELAYED_RELEASE_TABLET | Freq: Every day | ORAL | 0 refills | Status: AC

## 2020-06-03 NOTE — ED Notes (Signed)
Pt. In no distress has no cough noted

## 2020-06-03 NOTE — ED Triage Notes (Signed)
Chest pain x 2 days. She was seen for same yesterday.

## 2020-06-03 NOTE — ED Provider Notes (Signed)
Emergency Department Provider Note   I have reviewed the triage vital signs and the nursing notes.   HISTORY  Chief Complaint Chest Pain and Shortness of Breath   HPI Melanie Pineda is a 34 y.o. female with past medical history reviewed below presents to the emergency department with intermittent but now constant central to left-sided chest pain.  Patient has some mild cough but associated shortness of breath.  Her symptoms are worse with talking, breathing, movement.  She has had a total of 3 days of pain with no clear provoking factors.  She came to the emergency department yesterday and had some blood work along with chest x-ray but was not evaluated by a provider and ultimately left without being seen.   She returns today for reevaluation of her pain.  No radiation of symptoms or other modifying factors. No COVID contacts.  She has had some neck and body stiffness in addition to her chest discomfort but no fever or chills. No sore throat or diarrhea.   Past Medical History:  Diagnosis Date  . Anemia   . Bipolar 1 disorder (HCC)   . IBS (irritable bowel syndrome)   . Reflux esophagitis     There are no problems to display for this patient.   History reviewed. No pertinent surgical history.  Allergies Ultram [tramadol], Bactrim [sulfamethoxazole-trimethoprim], Progesterone, and Sulfa antibiotics  No family history on file.  Social History Social History   Tobacco Use  . Smoking status: Former Games developer  . Smokeless tobacco: Never Used  Vaping Use  . Vaping Use: Never used  Substance Use Topics  . Alcohol use: No  . Drug use: Yes    Types: Marijuana    Review of Systems  Constitutional: No fever/chills Eyes: No visual changes. ENT: No sore throat. Cardiovascular: Positive chest pain. Respiratory: Positive shortness of breath. Gastrointestinal: No abdominal pain.  No nausea, no vomiting.  No diarrhea.  No constipation. Genitourinary: Negative for  dysuria. Musculoskeletal: Negative for back pain. Skin: Negative for rash. Neurological: Negative for headaches, focal weakness or numbness.  10-point ROS otherwise negative.  ____________________________________________   PHYSICAL EXAM:  VITAL SIGNS: ED Triage Vitals  Enc Vitals Group     BP 06/03/20 1221 123/80     Pulse Rate 06/03/20 1221 66     Resp 06/03/20 1221 14     Temp 06/03/20 1221 98.1 F (36.7 C)     Temp Source 06/03/20 1221 Oral     SpO2 06/03/20 1221 99 %     Weight 06/03/20 1219 158 lb 1.1 oz (71.7 kg)     Height 06/03/20 1219 5\' 6"  (1.676 m)   Constitutional: Alert and oriented. Well appearing and in no acute distress. Eyes: Conjunctivae are normal.  Head: Atraumatic. Nose: No congestion/rhinnorhea. Mouth/Throat: Mucous membranes are moist.   Neck: No stridor.   Cardiovascular: Normal rate, regular rhythm. Good peripheral circulation. Grossly normal heart sounds.   Respiratory: Normal respiratory effort.  No retractions. Lungs CTAB. Gastrointestinal: Soft and nontender. No distention.  Musculoskeletal: No gross deformities of extremities. Neurologic:  Normal speech and language.  Skin:  Skin is warm, dry and intact. No rash noted.   ____________________________________________   LABS (all labs ordered are listed, but only abnormal results are displayed)  Labs Reviewed  HEPATIC FUNCTION PANEL - Abnormal; Notable for the following components:      Result Value   Alkaline Phosphatase 37 (*)    All other components within normal limits  SARS CORONAVIRUS 2 BY  RT PCR (HOSPITAL ORDER, PERFORMED IN [facility])  D-DIMER, QUANTITATIVE (NOT AT [facility])  LIPASE, BLOOD  TROPONIN I (HIGH SENSITIVITY)   ____________________________________________  EKG  Sinus rhythm. Normal axis. Narrow QRS. No ST elevation or depression. Normal EKG. No STEMI  ____________________________________________  RADIOLOGY  DG Chest 2 View  Result Date:  06/02/2020 CLINICAL DATA:  Chest pain EXAM: CHEST - 2 VIEW COMPARISON:  07/15/2018 FINDINGS: The heart size and mediastinal contours are within normal limits. Both lungs are clear. The visualized skeletal structures are unremarkable. IMPRESSION: No active cardiopulmonary disease. Electronically Signed   By: Deatra Robinson M.D.   On: 06/02/2020 19:43    ____________________________________________   PROCEDURES  Procedure(s) performed:   Procedures  None  ____________________________________________   INITIAL IMPRESSION / ASSESSMENT AND PLAN / ED COURSE  Pertinent labs & imaging results that were available during my care of the patient were reviewed by me and considered in my medical decision making (see chart for details).   Patient presents to the emergency department valuation of chest pain with shortness of breath.  Chest pain is somewhat atypical, sharp, worse with movement.  Labs from ED triage yesterday showed normal troponin, clear chest x-ray, no other abnormalities.  I have added on a repeat troponin with constant pain since that time along with a D-dimer.  Patient is having some mild nausea and so I added on LFTs along with lipase as well.  She has no focal abdominal tenderness. Will give Maalox here with possible GI source of pain. Very low suspicion for PE/ACS. Low risk by HEART score.   Labs and imaging reviewed. No acute findings. Plan for close PCP follow up. Discussed ED return precautions.  ____________________________________________  FINAL CLINICAL IMPRESSION(S) / ED DIAGNOSES  Final diagnoses:  Precordial chest pain  Nausea     MEDICATIONS GIVEN DURING THIS VISIT:  Medications  alum & mag hydroxide-simeth (MAALOX/MYLANTA) 200-200-20 MG/5ML suspension 15 mL (15 mLs Oral Given 06/03/20 1615)  ondansetron (ZOFRAN) injection 4 mg (4 mg Intravenous Given 06/03/20 1613)     NEW OUTPATIENT MEDICATIONS STARTED DURING THIS VISIT:  Discharge Medication List as of  06/03/2020  5:23 PM    START taking these medications   Details  ondansetron (ZOFRAN ODT) 4 MG disintegrating tablet Take 1 tablet (4 mg total) by mouth every 8 (eight) hours as needed., Starting Thu 06/03/2020, Normal    pantoprazole (PROTONIX) 40 MG tablet Take 1 tablet (40 mg total) by mouth daily., Starting Thu 06/03/2020, Until Sat 07/03/2020, Normal    sucralfate (CARAFATE) 1 g tablet Take 1 tablet (1 g total) by mouth 4 (four) times daily -  with meals and at bedtime., Starting Thu 06/03/2020, Normal        Note:  This document was prepared using Dragon voice recognition software and Brenes include unintentional dictation errors.  Alona Bene, MD, [facility]    Anairis Knick, Arlyss Repress, MD 06/07/20 1101

## 2020-06-03 NOTE — Discharge Instructions (Signed)
You were seen in the emergency room today with chest pain.  Your repeat heart enzymes and blood clot labs are normal.  I have sent over some prescriptions to the pharmacy to help with your symptoms.  Please follow closely with your primary care doctor.  If you have 1 of listed 1 for you here.  Return to the emergency department any new or suddenly worsening symptoms.

## 2020-07-11 IMAGING — US US OB TRANSVAGINAL
1 series · 14 of 28 positions shown · non-contrast
Comparison: None.

CLINICAL DATA: First trimester of pregnancy, vaginal bleeding.

EXAM:
OBSTETRIC <14 WK US AND TRANSVAGINAL OB US
TECHNIQUE: Both transabdominal and transvaginal ultrasound examinations were
performed for complete evaluation of the gestation as well as the
maternal uterus, adnexal regions, and pelvic cul-de-sac.
Transvaginal technique was performed to assess early pregnancy.

[Series 1: us ob transvaginal · 0.21mm/px · 14 of 97 slices shown]
[im 4/97]
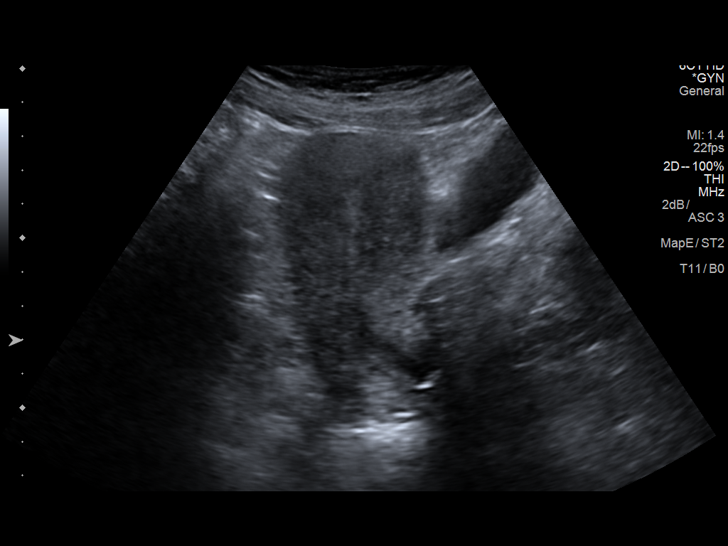
[im 11/97]
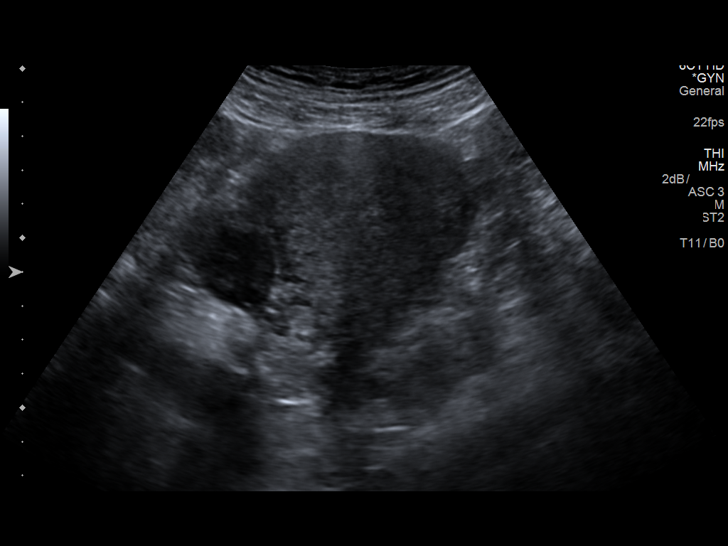
[im 18/97]
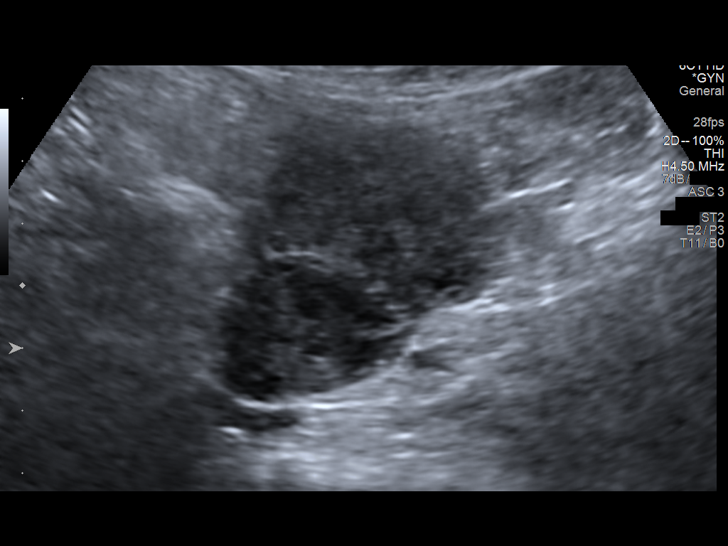
[im 25/97]
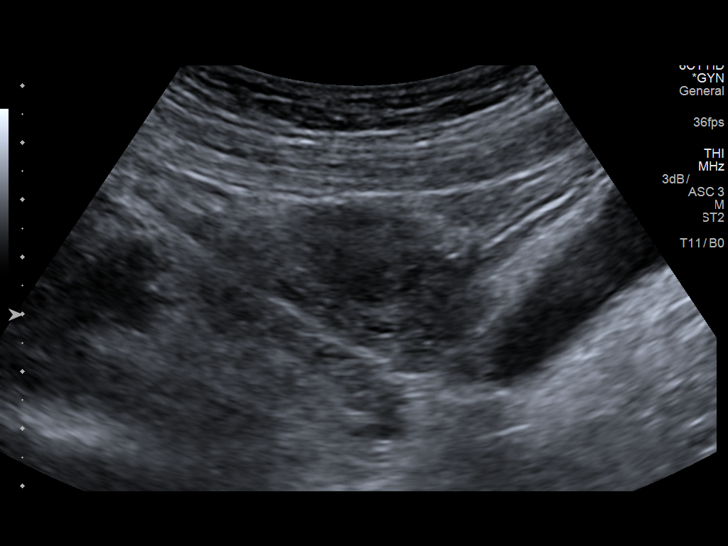
[im 33/97]
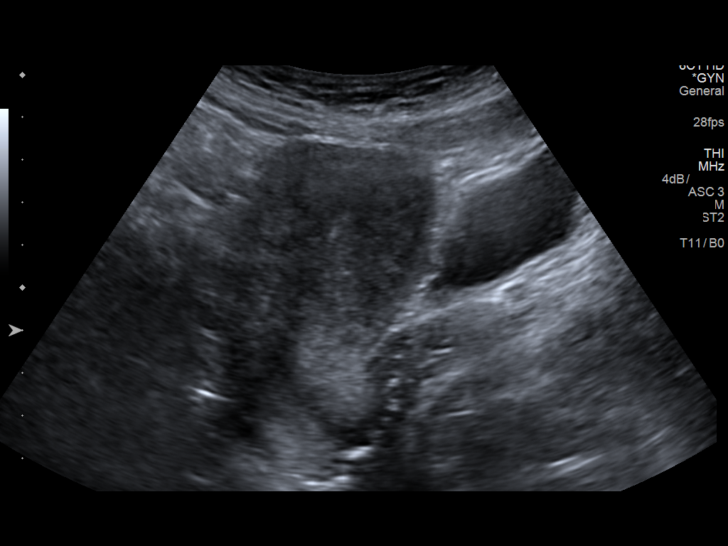
[im 40/97]
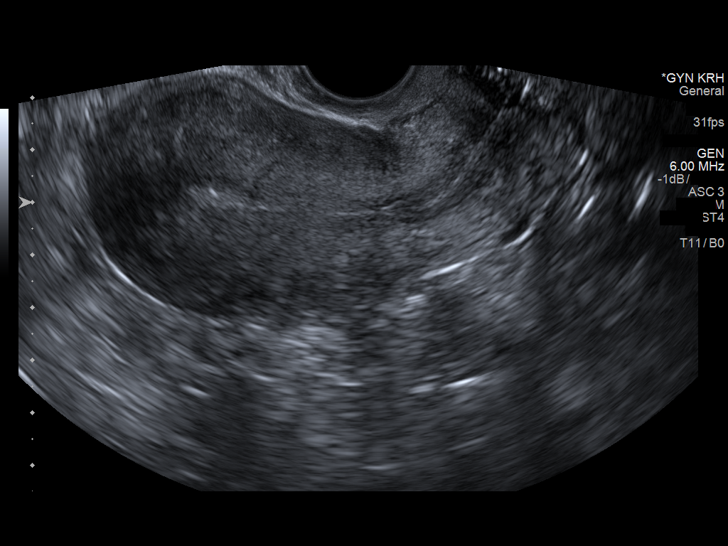
[im 47/97]
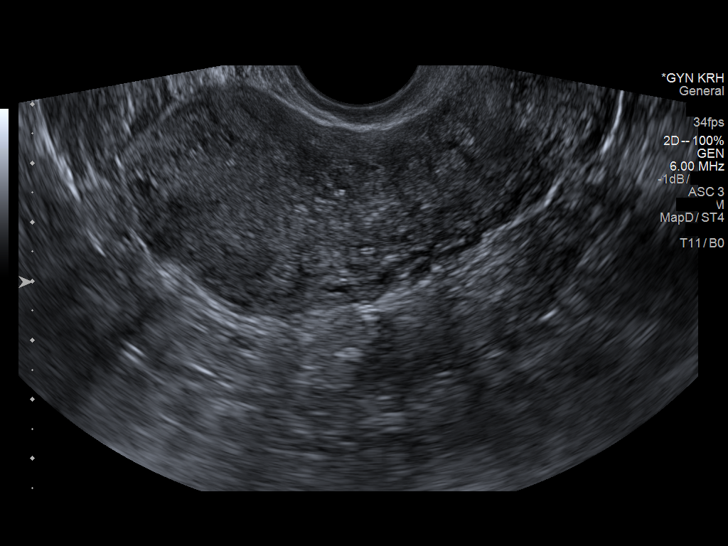
[im 54/97]
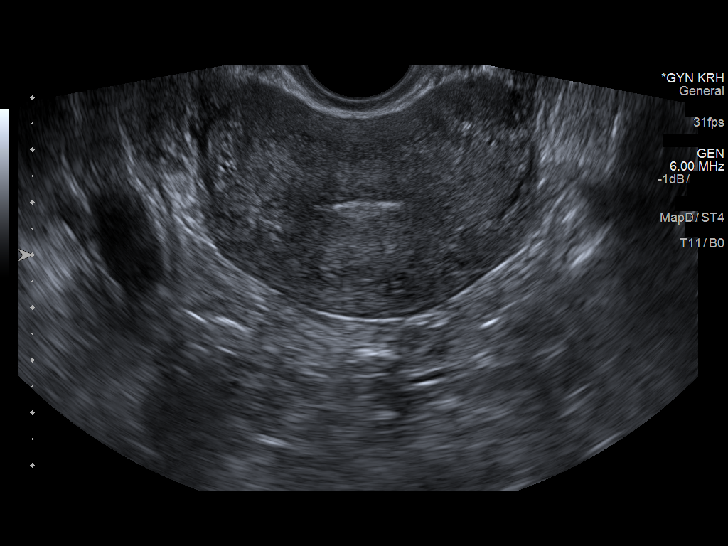
[im 61/97]
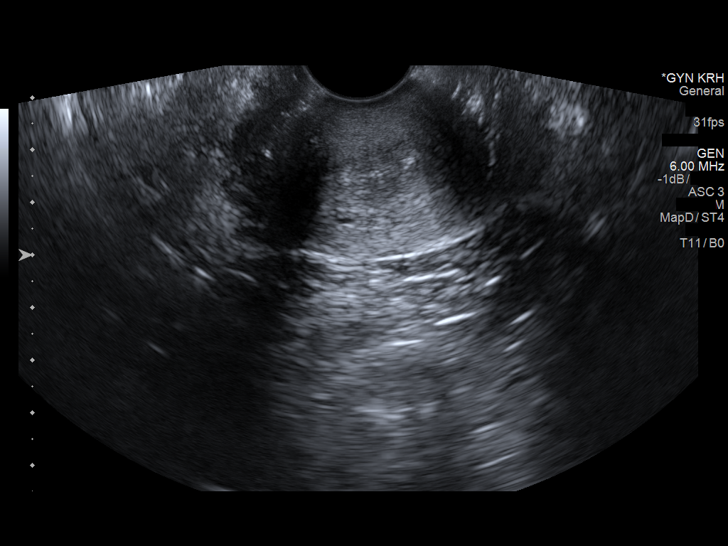
[im 68/97]
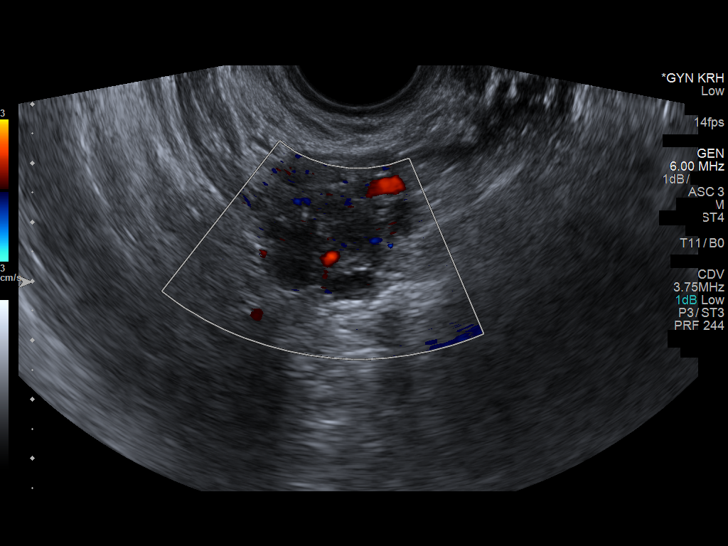
[im 75/97]
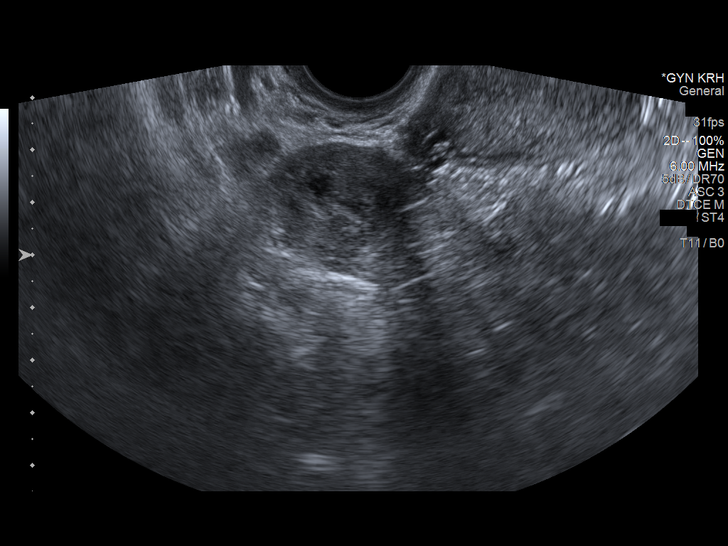
[im 82/97]
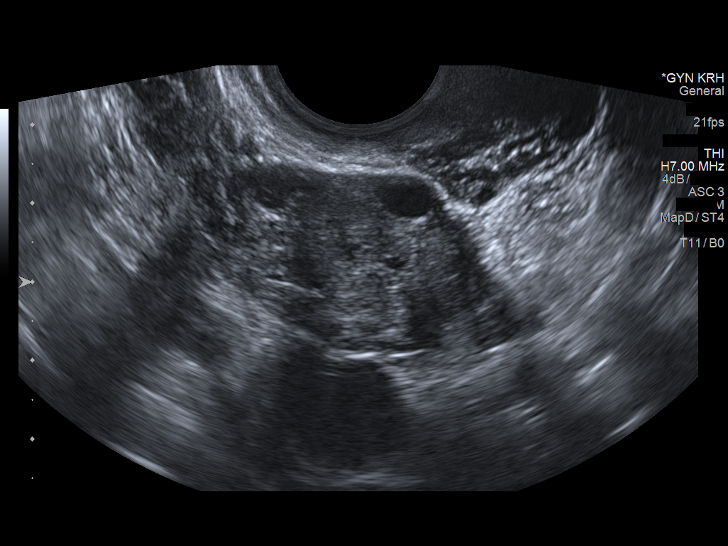
[im 89/97]
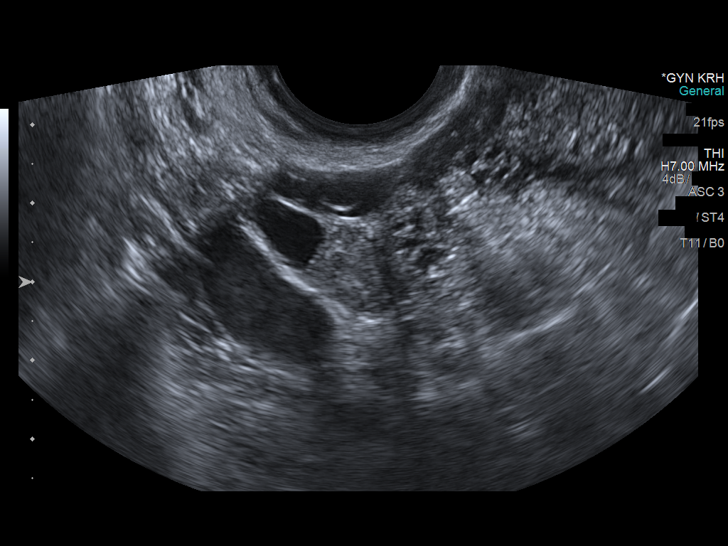
[im 97/97]
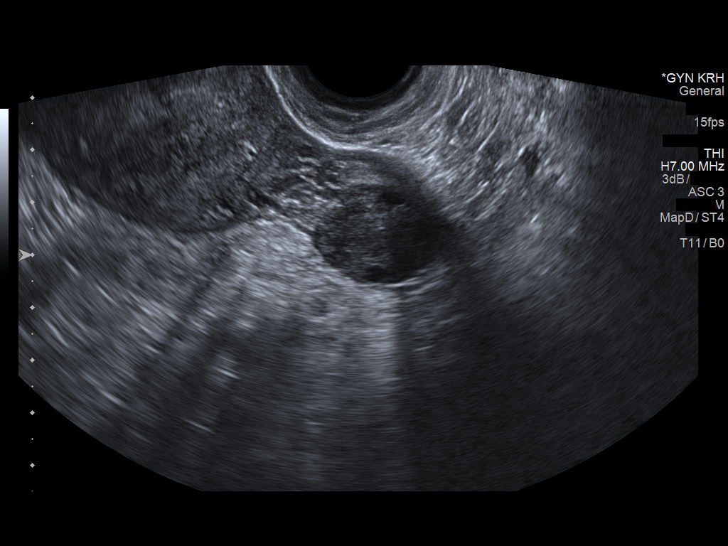

[14 of 28 positions shown; findings below may reference images not displayed]

FINDINGS: Intrauterine gestational sac: Not visualized.

Yolk sac:  Not visualized.

Embryo:  Not visualized.

Cardiac Activity: Not visualized.

Subchorionic hemorrhage:  None visualized.

Maternal uterus/adnexae: Ovaries are unremarkable. Trace free fluid
is noted which most likely is physiologic.
IMPRESSION: No intrauterine gestational sac, yolk sac, fetal pole, or cardiac
activity visualized. Differential considerations include
intrauterine gestation too early to be sonographically visualized,
spontaneous abortion, or ectopic pregnancy. Consider follow-up
ultrasound in 14 days and serial quantitative beta HCG follow-up.

## 2020-08-03 ENCOUNTER — Encounter (HOSPITAL_BASED_OUTPATIENT_CLINIC_OR_DEPARTMENT_OTHER): Payer: Self-pay | Admitting: *Deleted

## 2020-08-03 ENCOUNTER — Other Ambulatory Visit: Payer: Self-pay

## 2020-08-03 ENCOUNTER — Emergency Department (HOSPITAL_BASED_OUTPATIENT_CLINIC_OR_DEPARTMENT_OTHER)
Admission: EM | Admit: 2020-08-03 | Discharge: 2020-08-03 | Disposition: A | Discharge status: Home / Self Care | Payer: Medicaid Other | Attending: Emergency Medicine | Admitting: Emergency Medicine

## 2020-08-03 DIAGNOSIS — Z5321 Procedure and treatment not carried out due to patient leaving prior to being seen by health care provider: Secondary | ICD-10-CM | POA: Diagnosis not present

## 2020-08-03 DIAGNOSIS — Y92410 Unspecified street and highway as the place of occurrence of the external cause: Secondary | ICD-10-CM | POA: Insufficient documentation

## 2020-08-03 DIAGNOSIS — R519 Headache, unspecified: Secondary | ICD-10-CM | POA: Diagnosis not present

## 2020-08-03 DIAGNOSIS — S20219A Contusion of unspecified front wall of thorax, initial encounter: Secondary | ICD-10-CM | POA: Diagnosis present

## 2020-08-03 DIAGNOSIS — M25559 Pain in unspecified hip: Secondary | ICD-10-CM | POA: Diagnosis not present
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
## 2020-08-03 NOTE — ED Notes (Signed)
Called pt   No response from lobby  

## 2020-08-04 ENCOUNTER — Emergency Department (HOSPITAL_BASED_OUTPATIENT_CLINIC_OR_DEPARTMENT_OTHER): Payer: Medicaid Other

## 2020-08-04 ENCOUNTER — Emergency Department (HOSPITAL_BASED_OUTPATIENT_CLINIC_OR_DEPARTMENT_OTHER)
Admission: EM | Admit: 2020-08-04 | Discharge: 2020-08-04 | Disposition: A | Discharge status: Home / Self Care | Payer: Medicaid Other | Attending: Emergency Medicine | Admitting: Emergency Medicine

## 2020-08-04 ENCOUNTER — Encounter (HOSPITAL_BASED_OUTPATIENT_CLINIC_OR_DEPARTMENT_OTHER): Payer: Self-pay | Admitting: Radiology

## 2020-08-04 DIAGNOSIS — S199XXA Unspecified injury of neck, initial encounter: Secondary | ICD-10-CM | POA: Diagnosis present

## 2020-08-04 DIAGNOSIS — F1590 Other stimulant use, unspecified, uncomplicated: Secondary | ICD-10-CM | POA: Insufficient documentation

## 2020-08-04 DIAGNOSIS — S161XXA Strain of muscle, fascia and tendon at neck level, initial encounter: Secondary | ICD-10-CM | POA: Diagnosis not present

## 2020-08-04 DIAGNOSIS — R0789 Other chest pain: Secondary | ICD-10-CM | POA: Insufficient documentation

## 2020-08-04 DIAGNOSIS — R112 Nausea with vomiting, unspecified: Secondary | ICD-10-CM | POA: Diagnosis not present

## 2020-08-04 DIAGNOSIS — Z87891 Personal history of nicotine dependence: Secondary | ICD-10-CM | POA: Insufficient documentation

## 2020-08-04 DIAGNOSIS — M25512 Pain in left shoulder: Secondary | ICD-10-CM | POA: Diagnosis not present

## 2020-08-04 DIAGNOSIS — S301XXA Contusion of abdominal wall, initial encounter: Secondary | ICD-10-CM | POA: Diagnosis not present

## 2020-08-04 DIAGNOSIS — R110 Nausea: Secondary | ICD-10-CM

## 2020-08-04 LAB — CBC
HCT: 42.6 % (ref 36.0–46.0)
Hemoglobin: 14.7 g/dL (ref 12.0–15.0)
MCH: 32.9 pg (ref 26.0–34.0)
MCHC: 34.5 g/dL (ref 30.0–36.0)
MCV: 95.3 fL (ref 80.0–100.0)
Platelets: 222 10*3/uL (ref 150–400)
RBC: 4.47 MIL/uL (ref 3.87–5.11)
RDW: 12 % (ref 11.5–15.5)
WBC: 5.8 10*3/uL (ref 4.0–10.5)
nRBC: 0 % (ref 0.0–0.2)

## 2020-08-04 LAB — COMPREHENSIVE METABOLIC PANEL
ALT: 13 U/L (ref 0–44)
AST: 15 U/L (ref 15–41)
Albumin: 4 g/dL (ref 3.5–5.0)
Alkaline Phosphatase: 39 U/L (ref 38–126)
Anion gap: 9 (ref 5–15)
BUN: 8 mg/dL (ref 6–20)
CO2: 26 mmol/L (ref 22–32)
Calcium: 8.9 mg/dL (ref 8.9–10.3)
Chloride: 102 mmol/L (ref 98–111)
Creatinine, Ser: 0.72 mg/dL (ref 0.44–1.00)
GFR, Estimated: >60 mL/min (ref >60)
Glucose, Bld: 100 mg/dL — ABNORMAL HIGH (ref 70–99)
Potassium: 3.3 mmol/L — ABNORMAL LOW (ref 3.5–5.1)
Sodium: 137 mmol/L (ref 135–145)
Total Bilirubin: 0.6 mg/dL (ref 0.3–1.2)
Total Protein: 7.4 g/dL (ref 6.5–8.1)

## 2020-08-04 LAB — PREGNANCY, URINE: Preg Test, Ur: NEGATIVE

## 2020-08-04 MED ORDER — OXYCODONE-ACETAMINOPHEN 5-325 MG PO TABS
1.0000 | ORAL_TABLET | Freq: Once | ORAL | Status: AC
  Administered 2020-08-04: 1 via ORAL
  Filled 2020-08-04: qty 1

## 2020-08-04 MED ORDER — METHOCARBAMOL 500 MG PO TABS: 500.0000 mg | ORAL_TABLET | Freq: Two times a day (BID) | ORAL | 0 refills | Status: AC

## 2020-08-04 MED ORDER — HYDROCODONE-ACETAMINOPHEN 5-325 MG PO TABS: 1.0000 | ORAL_TABLET | Freq: Four times a day (QID) | ORAL | 0 refills | Status: AC | PRN

## 2020-08-04 MED ORDER — IOHEXOL 300 MG/ML  SOLN
100.0000 mL | Freq: Once | INTRAMUSCULAR | Status: AC | PRN
  Administered 2020-08-04: 100 mL via INTRAVENOUS

## 2020-08-04 MED ORDER — ACETAMINOPHEN 325 MG PO TABS
650.0000 mg | ORAL_TABLET | Freq: Once | ORAL | Status: AC
  Administered 2020-08-04: 650 mg via ORAL
  Filled 2020-08-04: qty 2

## 2020-08-04 MED ORDER — ONDANSETRON 4 MG PO TBDP: 4.0000 mg | ORAL_TABLET | Freq: Three times a day (TID) | ORAL | 0 refills | Status: DC | PRN

## 2020-08-04 MED ORDER — ONDANSETRON 4 MG PO TBDP
4.0000 mg | ORAL_TABLET | Freq: Once | ORAL | Status: AC
  Administered 2020-08-04: 4 mg via ORAL
  Filled 2020-08-04: qty 1
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
## 2020-08-04 NOTE — ED Notes (Signed)
Patient transported to CT 
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
## 2020-11-03 ENCOUNTER — Encounter: Payer: Medicaid Other | Admitting: Obstetrics & Gynecology

## 2020-11-17 ENCOUNTER — Emergency Department (HOSPITAL_BASED_OUTPATIENT_CLINIC_OR_DEPARTMENT_OTHER)
Admission: EM | Admit: 2020-11-17 | Discharge: 2020-11-17 | Disposition: A | Discharge status: Home / Self Care | Payer: Medicaid Other | Attending: Emergency Medicine | Admitting: Emergency Medicine

## 2020-11-17 ENCOUNTER — Other Ambulatory Visit: Payer: Self-pay

## 2020-11-17 DIAGNOSIS — R1110 Vomiting, unspecified: Secondary | ICD-10-CM | POA: Insufficient documentation

## 2020-11-17 DIAGNOSIS — Z87891 Personal history of nicotine dependence: Secondary | ICD-10-CM | POA: Diagnosis not present

## 2020-11-17 DIAGNOSIS — R197 Diarrhea, unspecified: Secondary | ICD-10-CM

## 2020-11-17 DIAGNOSIS — R1030 Lower abdominal pain, unspecified: Secondary | ICD-10-CM | POA: Diagnosis not present

## 2020-11-17 DIAGNOSIS — U071 COVID-19: Secondary | ICD-10-CM | POA: Insufficient documentation

## 2020-11-17 DIAGNOSIS — R112 Nausea with vomiting, unspecified: Secondary | ICD-10-CM

## 2020-11-17 LAB — COMPREHENSIVE METABOLIC PANEL
ALT: 12 U/L (ref 0–44)
AST: 17 U/L (ref 15–41)
Albumin: 4.1 g/dL (ref 3.5–5.0)
Alkaline Phosphatase: 42 U/L (ref 38–126)
Anion gap: 9 (ref 5–15)
BUN: 7 mg/dL (ref 6–20)
CO2: 23 mmol/L (ref 22–32)
Calcium: 9.1 mg/dL (ref 8.9–10.3)
Chloride: 104 mmol/L (ref 98–111)
Creatinine, Ser: 0.56 mg/dL (ref 0.44–1.00)
GFR, Estimated: >60 mL/min (ref >60)
Glucose, Bld: 100 mg/dL — ABNORMAL HIGH (ref 70–99)
Potassium: 3.6 mmol/L (ref 3.5–5.1)
Sodium: 136 mmol/L (ref 135–145)
Total Bilirubin: 0.5 mg/dL (ref 0.3–1.2)
Total Protein: 7.3 g/dL (ref 6.5–8.1)

## 2020-11-17 LAB — CBC
HCT: 41.1 % (ref 36.0–46.0)
Hemoglobin: 14.3 g/dL (ref 12.0–15.0)
MCH: 32.9 pg (ref 26.0–34.0)
MCHC: 34.8 g/dL (ref 30.0–36.0)
MCV: 94.5 fL (ref 80.0–100.0)
Platelets: 285 10*3/uL (ref 150–400)
RBC: 4.35 MIL/uL (ref 3.87–5.11)
RDW: 11.6 % (ref 11.5–15.5)
WBC: 4.7 10*3/uL (ref 4.0–10.5)
nRBC: 0 % (ref 0.0–0.2)

## 2020-11-17 LAB — LIPASE, BLOOD: Lipase: 28 U/L (ref 11–51)

## 2020-11-17 LAB — URINALYSIS, ROUTINE W REFLEX MICROSCOPIC
Bilirubin Urine: NEGATIVE
Glucose, UA: NEGATIVE mg/dL
Hgb urine dipstick: NEGATIVE
Ketones, ur: NEGATIVE mg/dL
Leukocytes,Ua: NEGATIVE
Nitrite: NEGATIVE
Protein, ur: NEGATIVE mg/dL
Specific Gravity, Urine: 1.01 (ref 1.005–1.030)
pH: 8 (ref 5.0–8.0)

## 2020-11-17 LAB — PREGNANCY, URINE: Preg Test, Ur: NEGATIVE

## 2020-11-17 MED ORDER — ONDANSETRON 4 MG PO TBDP
4.0000 mg | ORAL_TABLET | Freq: Once | ORAL | Status: AC | PRN
  Administered 2020-11-17: 4 mg via ORAL
  Filled 2020-11-17: qty 1

## 2020-11-17 MED ORDER — SODIUM CHLORIDE 0.9 % IV BOLUS
1000.0000 mL | Freq: Once | INTRAVENOUS | Status: AC
  Administered 2020-11-17: 1000 mL via INTRAVENOUS

## 2020-11-17 MED ORDER — LOPERAMIDE HCL 2 MG PO CAPS
4.0000 mg | ORAL_CAPSULE | Freq: Once | ORAL | Status: AC
  Administered 2020-11-17: 4 mg via ORAL
  Filled 2020-11-17: qty 2

## 2020-11-17 MED ORDER — IBUPROFEN 800 MG PO TABS
800.0000 mg | ORAL_TABLET | Freq: Once | ORAL | Status: AC
  Administered 2020-11-17: 800 mg via ORAL
  Filled 2020-11-17: qty 1

## 2020-11-17 MED ORDER — LOPERAMIDE HCL 2 MG PO CAPS: 2.0000 mg | ORAL_CAPSULE | Freq: Four times a day (QID) | ORAL | 0 refills | Status: AC | PRN

## 2020-11-17 MED ORDER — ONDANSETRON HCL 4 MG PO TABS: 4.0000 mg | ORAL_TABLET | Freq: Three times a day (TID) | ORAL | 0 refills | Status: AC | PRN

## 2020-11-17 NOTE — Discharge Instructions (Signed)
Your labs did not show any significant electrolyte abnormalities.  Blood pressure has improved after you have gotten some fluids.  I have discharged you with an antidiarrheal and antinausea medication.  Please make sure that you are drinking plenty of fluids especially if you are abusing them through diarrhea or vomiting.  Return for any new or worsening symptoms.  Get help right away if: You have chest pain. You feel extremely weak or you faint. You have bloody or black stools or stools that look like tar. You have severe pain, cramping, or bloating in your abdomen. You have trouble breathing or you are breathing very quickly. Your heart is beating very quickly. Your skin feels cold and clammy. You feel confused. You have signs of dehydration, such as: Dark urine, very little urine, or no urine. Cracked lips. Dry mouth. Sunken eyes. Sleepiness. Weakness.

## 2020-11-17 NOTE — ED Triage Notes (Addendum)
Pt diagnosed with covid on 1/25. Been having diarrhea since then, started having vomiting this am. Complaining of generalized abdominal pain, states unable to keep anything down. C/o fatigue, body aches and lightheadedness

## 2020-11-17 NOTE — ED Provider Notes (Signed)
[facility] EMERGENCY DEPARTMENT Provider Note   CSN: [id number] Arrival date & time: 11/17/20  1009     History Chief Complaint  Patient presents with  . Abdominal Pain  . Vomiting    COVID +    Melanie Pineda is a 35 y.o. female who presents with a cc of weakness. Patient was diagnosed with COVID 19 on 11/09/2020. The patient has had persistent watery diarrhea daily. She has had poor appetite, as well as poor fluid intake.  She feels dizzy when she stands.  Had a couple episodes of nonbloody nonbilious vomitus prior to arriving today.  She has had some intermittent headaches and body aches however overall her symptoms seem to be improving.  She denies chest pain, shortness of breath.  Patient is status post treatment for recent urinary tract infection.  She denies any urinary symptoms at this time HPI     Past Medical History:  Diagnosis Date  . Anemia   . Bipolar 1 disorder (HCC)   . IBS (irritable bowel syndrome)   . Reflux esophagitis     There are no problems to display for this patient.   No past surgical history on file.   OB History    Gravida  3   Para  2   Term  2   Preterm      AB      Living  2     SAB      IAB      Ectopic      Multiple      Live Births  1           No family history on file.  Social History   Tobacco Use  . Smoking status: Former Games developer  . Smokeless tobacco: Never Used  Vaping Use  . Vaping Use: Never used  Substance Use Topics  . Alcohol use: No  . Drug use: Yes    Types: Marijuana    Home Medications Prior to Admission medications   Medication Sig Start Date End Date Taking? Authorizing Provider  clonazePAM (KLONOPIN) 1 MG tablet Take 1 mg by mouth 2 (two) times daily.    [provider]  esomeprazole (NEXIUM) 40 MG capsule Take by mouth. 06/17/20   [provider]  HYDROcodone-acetaminophen (NORCO/VICODIN) 5-325 MG tablet Take 1 tablet by mouth every 6 (six) hours as needed for  severe pain. 08/04/20   Gailen Shelter, PA  lamoTRIgine (LAMICTAL) 100 MG tablet Take 100 mg by mouth daily.  04/26/20   [provider]  metroNIDAZOLE (FLAGYL) 500 MG tablet Take 500 mg by mouth 2 (two) times daily. 07/07/20   [provider]  ondansetron (ZOFRAN ODT) 4 MG disintegrating tablet Take 1 tablet (4 mg total) by mouth every 8 (eight) hours as needed for nausea or vomiting. 08/04/20   Gailen Shelter, PA  pantoprazole (PROTONIX) 40 MG tablet Take 1 tablet (40 mg total) by mouth daily. 06/03/20 07/03/20  Long, Arlyss Repress, MD  sucralfate (CARAFATE) 1 g tablet Take 1 tablet (1 g total) by mouth 4 (four) times daily -  with meals and at bedtime. 06/03/20   Long, Arlyss Repress, MD    Allergies    Ultram [tramadol], Bactrim [sulfamethoxazole-trimethoprim], Progesterone, and Sulfa antibiotics  Review of Systems   Review of Systems Ten systems reviewed and are negative for acute change, except as noted in the HPI.   Physical Exam Updated Vital Signs BP 111/88 (BP Location: Left Arm)  Pulse 86   Temp 98.3 F (36.8 C) (Oral)   Resp 18   Ht 5\' 6"  (1.676 m)   Wt 72.6 kg   LMP 11/15/2020   SpO2 98%   BMI 25.82 kg/m   Physical Exam Vitals and nursing note reviewed.  Constitutional:      General: She is not in acute distress.    Appearance: She is well-developed and well-nourished. She is ill-appearing. She is not toxic-appearing or diaphoretic.  HENT:     Head: Normocephalic and atraumatic.  Eyes:     General: No scleral icterus.    Conjunctiva/sclera: Conjunctivae normal.  Cardiovascular:     Rate and Rhythm: Normal rate and regular rhythm.     Heart sounds: Normal heart sounds. No murmur heard. No friction rub. No gallop.   Pulmonary:     Effort: Pulmonary effort is normal. No respiratory distress.     Breath sounds: Normal breath sounds.  Abdominal:     General: Bowel sounds are normal. There is no distension.     Palpations: Abdomen is soft. There is no  mass.     Tenderness: There is abdominal tenderness in the suprapubic area. There is no guarding.  Musculoskeletal:     Cervical back: Normal range of motion.  Skin:    General: Skin is warm and dry.  Neurological:     Mental Status: She is alert and oriented to person, place, and time.  Psychiatric:        Behavior: Behavior normal.     ED Results / Procedures / Treatments   Labs (all labs ordered are listed, but only abnormal results are displayed) Labs Reviewed  CBC  LIPASE, BLOOD  COMPREHENSIVE METABOLIC PANEL  URINALYSIS, ROUTINE W REFLEX MICROSCOPIC  PREGNANCY, URINE    EKG None  Radiology No results found.  Procedures Procedures   Medications Ordered in ED Medications  loperamide (IMODIUM) capsule 4 mg (has no administration in time range)  sodium chloride 0.9 % bolus 1,000 mL (has no administration in time range)  ibuprofen (ADVIL) tablet 800 mg (has no administration in time range)  ondansetron (ZOFRAN-ODT) disintegrating tablet 4 mg (4 mg Oral Given 11/17/20 1031)    ED Course  I have reviewed the triage vital signs and the nursing notes.  Pertinent labs & imaging results that were available during my care of the patient were reviewed by me and considered in my medical decision making (see chart for details).    MDM Rules/Calculators/A&P                           35 year old female here who is Covid positive.  She has had persistent diarrhea and poor oral intake and presents with weakness and lightheadedness.The differential diagnosis of weakness includes but is not limited to neurologic causes (GBS, myasthenia gravis, CVA, MS, ALS, transverse myelitis, spinal cord injury, CVA, botulism, ) and other causes: ACS, Arrhythmia, syncope, orthostatic hypotension, sepsis, hypoglycemia, electrolyte disturbance, hypothyroidism, respiratory failure, symptomatic anemia, dehydration, heat injury, polypharmacy, malignancy. I ordered interpreted and reviewed labs which  include CBC without abnormality, CMP with mildly elevated glucose of insignificant value.  Lipase, urine pregnancy and urinalysis within normal limits.  Patient initially with soft blood pressure at 105/53.  She is given a bolus of fluid, ibuprofen and an Imodium capsule along with Zofran which was significantly helpful for her nausea.  She has had no active vomiting or diarrhea here in the emergency department.  Patient  advised to continue with oral rehydration.  She is Ambulatory without signs of near syncope and appears otherwise appropriate for discharge at this time.  Discussed return precautions.  Melanie Pineda was evaluated in Emergency Department on 11/17/2020 for the symptoms described in the history of present illness. She was evaluated in the context of the global COVID-19 pandemic, which necessitated consideration that the patient might be at risk for infection with the SARS-CoV-2 virus that causes COVID-19. Institutional protocols and algorithms that pertain to the evaluation of patients at risk for COVID-19 are in a state of rapid change based on information released by regulatory bodies including the CDC and federal and state organizations. These policies and algorithms were followed during the patient's care in the ED.     Final Clinical Impression(s) / ED Diagnoses Final diagnoses:  None    Rx / DC Orders ED Discharge Orders    None       Arthor Captain, PA-C 11/17/20 1511    Melene Plan, DO 11/18/20 [phone]

## 2021-01-08 ENCOUNTER — Encounter (HOSPITAL_BASED_OUTPATIENT_CLINIC_OR_DEPARTMENT_OTHER): Payer: Self-pay | Admitting: *Deleted

## 2021-01-08 ENCOUNTER — Other Ambulatory Visit: Payer: Self-pay

## 2021-01-08 ENCOUNTER — Emergency Department (HOSPITAL_BASED_OUTPATIENT_CLINIC_OR_DEPARTMENT_OTHER): Payer: Medicaid Other

## 2021-01-08 ENCOUNTER — Emergency Department (HOSPITAL_BASED_OUTPATIENT_CLINIC_OR_DEPARTMENT_OTHER)
Admission: EM | Admit: 2021-01-08 | Discharge: 2021-01-08 | Disposition: A | Discharge status: Home / Self Care | Payer: Medicaid Other | Attending: Emergency Medicine | Admitting: Emergency Medicine

## 2021-01-08 DIAGNOSIS — G8929 Other chronic pain: Secondary | ICD-10-CM | POA: Insufficient documentation

## 2021-01-08 DIAGNOSIS — M549 Dorsalgia, unspecified: Secondary | ICD-10-CM | POA: Insufficient documentation

## 2021-01-08 DIAGNOSIS — R109 Unspecified abdominal pain: Secondary | ICD-10-CM | POA: Diagnosis not present

## 2021-01-08 DIAGNOSIS — Y92410 Unspecified street and highway as the place of occurrence of the external cause: Secondary | ICD-10-CM | POA: Insufficient documentation

## 2021-01-08 DIAGNOSIS — M542 Cervicalgia: Secondary | ICD-10-CM | POA: Insufficient documentation

## 2021-01-08 LAB — BASIC METABOLIC PANEL
Anion gap: 9 (ref 5–15)
BUN: 11 mg/dL (ref 6–20)
CO2: 25 mmol/L (ref 22–32)
Calcium: 8.8 mg/dL — ABNORMAL LOW (ref 8.9–10.3)
Chloride: 102 mmol/L (ref 98–111)
Creatinine, Ser: 0.75 mg/dL (ref 0.44–1.00)
GFR, Estimated: >60 mL/min (ref >60)
Glucose, Bld: 104 mg/dL — ABNORMAL HIGH (ref 70–99)
Potassium: 3.5 mmol/L (ref 3.5–5.1)
Sodium: 136 mmol/L (ref 135–145)

## 2021-01-08 LAB — URINALYSIS, ROUTINE W REFLEX MICROSCOPIC
Bilirubin Urine: NEGATIVE
Glucose, UA: NEGATIVE mg/dL
Ketones, ur: NEGATIVE mg/dL
Leukocytes,Ua: NEGATIVE
Nitrite: NEGATIVE
Protein, ur: NEGATIVE mg/dL
Specific Gravity, Urine: 1.025 (ref 1.005–1.030)
pH: 7.5 (ref 5.0–8.0)

## 2021-01-08 LAB — CBC WITH DIFFERENTIAL/PLATELET
Abs Immature Granulocytes: 0.01 10*3/uL (ref 0.00–0.07)
Basophils Absolute: 0 10*3/uL (ref 0.0–0.1)
Basophils Relative: 1 %
Eosinophils Absolute: 0.2 10*3/uL (ref 0.0–0.5)
Eosinophils Relative: 4 %
HCT: 38.4 % (ref 36.0–46.0)
Hemoglobin: 13.5 g/dL (ref 12.0–15.0)
Immature Granulocytes: 0 %
Lymphocytes Relative: 42 %
Lymphs Abs: 2.2 10*3/uL (ref 0.7–4.0)
MCH: 33.3 pg (ref 26.0–34.0)
MCHC: 35.2 g/dL (ref 30.0–36.0)
MCV: 94.6 fL (ref 80.0–100.0)
Monocytes Absolute: 0.5 10*3/uL (ref 0.1–1.0)
Monocytes Relative: 10 %
Neutro Abs: 2.3 10*3/uL (ref 1.7–7.7)
Neutrophils Relative %: 43 %
Platelets: 229 10*3/uL (ref 150–400)
RBC: 4.06 MIL/uL (ref 3.87–5.11)
RDW: 11.9 % (ref 11.5–15.5)
WBC: 5.2 10*3/uL (ref 4.0–10.5)
nRBC: 0 % (ref 0.0–0.2)

## 2021-01-08 LAB — URINALYSIS, MICROSCOPIC (REFLEX)

## 2021-01-08 LAB — PREGNANCY, URINE: Preg Test, Ur: NEGATIVE

## 2021-01-08 LAB — LIPASE, BLOOD: Lipase: 37 U/L (ref 11–51)

## 2021-01-08 LAB — HEPATIC FUNCTION PANEL
ALT: 10 U/L (ref 0–44)
AST: 15 U/L (ref 15–41)
Albumin: 3.9 g/dL (ref 3.5–5.0)
Alkaline Phosphatase: 37 U/L — ABNORMAL LOW (ref 38–126)
Bilirubin, Direct: 0.1 mg/dL (ref 0.0–0.2)
Indirect Bilirubin: 0.3 mg/dL (ref 0.3–0.9)
Total Bilirubin: 0.4 mg/dL (ref 0.3–1.2)
Total Protein: 7.1 g/dL (ref 6.5–8.1)

## 2021-01-08 MED ORDER — ACETAMINOPHEN 325 MG PO TABS
650.0000 mg | ORAL_TABLET | Freq: Once | ORAL | Status: AC
  Administered 2021-01-08: 650 mg via ORAL
  Filled 2021-01-08: qty 2

## 2021-01-08 MED ORDER — METHOCARBAMOL 500 MG PO TABS: 500.0000 mg | ORAL_TABLET | Freq: Two times a day (BID) | ORAL | 0 refills | Status: AC

## 2021-01-08 MED ORDER — IOHEXOL 300 MG/ML  SOLN
100.0000 mL | Freq: Once | INTRAMUSCULAR | Status: AC
  Administered 2021-01-08: 100 mL via INTRAVENOUS

## 2021-01-08 NOTE — ED Notes (Addendum)
This  Tech went to request a urine sample. Pt stated that she was never explained she needed to give a sample. This tech apologized to patient and notified RN. Pt was explained that the MD was able to change one of the test and the results for the test would be back soon so the Pt can go to radiology. Pt stated she has already been to the restroom twice without notifying staff.

## 2021-01-08 NOTE — ED Notes (Addendum)
Pt went to the restroom slamming the door, staff went to the restroom door to make sure Pt was ok. When Pt came out of restroom Pt slammed urine down on counter and stated that she was pissed. This tech collected urine and took it to the lab.

## 2021-01-08 NOTE — Discharge Instructions (Addendum)
At this time there does not appear to be the presence of an emergent medical condition, however there is always the potential for conditions to change. Please read and follow the below instructions.  Please return to the Emergency Department immediately for any new or worsening symptoms. Please be sure to follow up with your Primary Care Provider within one week regarding your visit today; please call their office to schedule an appointment even if you are feeling better for a follow-up visit. You Deskin use the muscle relaxer Robaxin as prescribed to help with your symptoms.  Do not drive or operate heavy machinery while taking Robaxin as it will make you drowsy.  Do not drink alcohol or take other sedating medications while taking Robaxin as this will worsen side effects.  Go to the nearest Emergency Department immediately if: You have fever or chills You have: Loss of feeling (numbness), tingling, or weakness in your arms or legs. Very bad neck pain, especially tenderness in the middle of the back of your neck. A change in your ability to control your pee or poop (stool). More pain in any area of your body. Swelling in any area of your body, especially your legs. Shortness of breath or light-headedness. Chest pain. Blood in your pee, poop, or vomit. Very bad pain in your belly (abdomen) or your back. Very bad headaches or headaches that are getting worse. Sudden vision loss or double vision. Your eye suddenly turns red. The black center of your eye (pupil) is an odd shape or size. You have any new/concerning or worsening of symptoms  Please read the additional information packets attached to your discharge summary.  Do not take your medicine if  develop an itchy rash, swelling in your mouth or lips, or difficulty breathing; call 911 and seek immediate emergency medical attention if this occurs.  You Truax review your lab tests and imaging results in their entirety on your MyChart account.   Please discuss all results of fully with your primary care provider and other specialist at your follow-up visit.  Note: Portions of this text [place] have been transcribed using voice recognition software. Every effort was made to ensure accuracy; however, inadvertent computerized transcription errors [place] still be present.
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
## 2021-01-08 NOTE — ED Notes (Addendum)
This RN alerted by EMT that pt has left. Pt not yet up for discharge.

## 2021-01-19 ENCOUNTER — Emergency Department (HOSPITAL_BASED_OUTPATIENT_CLINIC_OR_DEPARTMENT_OTHER)
Admission: EM | Admit: 2021-01-19 | Discharge: 2021-01-19 | Disposition: A | Discharge status: Home / Self Care | Payer: Medicaid Other | Attending: Emergency Medicine | Admitting: Emergency Medicine

## 2021-01-19 ENCOUNTER — Emergency Department (HOSPITAL_BASED_OUTPATIENT_CLINIC_OR_DEPARTMENT_OTHER): Payer: Medicaid Other

## 2021-01-19 ENCOUNTER — Encounter (HOSPITAL_BASED_OUTPATIENT_CLINIC_OR_DEPARTMENT_OTHER): Payer: Self-pay

## 2021-01-19 ENCOUNTER — Other Ambulatory Visit: Payer: Self-pay

## 2021-01-19 DIAGNOSIS — M546 Pain in thoracic spine: Secondary | ICD-10-CM | POA: Insufficient documentation

## 2021-01-19 DIAGNOSIS — Y92410 Unspecified street and highway as the place of occurrence of the external cause: Secondary | ICD-10-CM | POA: Diagnosis not present

## 2021-01-19 DIAGNOSIS — R519 Headache, unspecified: Secondary | ICD-10-CM | POA: Insufficient documentation

## 2021-01-19 DIAGNOSIS — Z87891 Personal history of nicotine dependence: Secondary | ICD-10-CM | POA: Diagnosis not present

## 2021-01-19 DIAGNOSIS — M542 Cervicalgia: Secondary | ICD-10-CM | POA: Diagnosis present

## 2021-01-19 DIAGNOSIS — R202 Paresthesia of skin: Secondary | ICD-10-CM | POA: Insufficient documentation

## 2021-01-19 LAB — PREGNANCY, URINE: Preg Test, Ur: NEGATIVE

## 2021-01-19 MED ORDER — METHOCARBAMOL 500 MG PO TABS: 500.0000 mg | ORAL_TABLET | Freq: Two times a day (BID) | ORAL | 0 refills | Status: DC

## 2021-01-19 MED ORDER — IBUPROFEN 800 MG PO TABS: 800.0000 mg | ORAL_TABLET | Freq: Three times a day (TID) | ORAL | 0 refills | Status: DC

## 2021-01-19 MED ORDER — KETOROLAC TROMETHAMINE 30 MG/ML IJ SOLN
30.0000 mg | Freq: Once | INTRAMUSCULAR | Status: AC
  Administered 2021-01-19: 30 mg via INTRAMUSCULAR
  Filled 2021-01-19: qty 1

## 2021-01-19 MED ORDER — ACETAMINOPHEN 325 MG PO TABS
650.0000 mg | ORAL_TABLET | Freq: Once | ORAL | Status: AC
  Administered 2021-01-19: 650 mg via ORAL
  Filled 2021-01-19: qty 2

## 2021-01-19 MED ORDER — METHOCARBAMOL 500 MG PO TABS
500.0000 mg | ORAL_TABLET | Freq: Once | ORAL | Status: AC
  Administered 2021-01-19: 500 mg via ORAL
  Filled 2021-01-19: qty 1

## 2021-01-19 MED ORDER — NAPROXEN 500 MG PO TABS: 500.0000 mg | ORAL_TABLET | Freq: Two times a day (BID) | ORAL | 0 refills | Status: DC

## 2021-01-19 MED ORDER — CYCLOBENZAPRINE HCL 10 MG PO TABS: 10.0000 mg | ORAL_TABLET | Freq: Two times a day (BID) | ORAL | 0 refills | Status: AC | PRN
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
## 2021-01-19 NOTE — ED Notes (Signed)
Pt requested a different medication for Robaxin because a co-payment was required. "I don't wanna pay for it" PA was advised and new medications flexeril and ibuprofen sent to pharmacy
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
## 2021-06-17 ENCOUNTER — Encounter (HOSPITAL_BASED_OUTPATIENT_CLINIC_OR_DEPARTMENT_OTHER): Payer: Self-pay

## 2021-06-17 ENCOUNTER — Emergency Department (HOSPITAL_BASED_OUTPATIENT_CLINIC_OR_DEPARTMENT_OTHER)
Admission: EM | Admit: 2021-06-17 | Discharge: 2021-06-17 | Disposition: A | Discharge status: Home / Self Care | Payer: Medicaid Other | Attending: Emergency Medicine | Admitting: Emergency Medicine

## 2021-06-17 ENCOUNTER — Other Ambulatory Visit: Payer: Self-pay

## 2021-06-17 ENCOUNTER — Emergency Department (HOSPITAL_BASED_OUTPATIENT_CLINIC_OR_DEPARTMENT_OTHER): Payer: Medicaid Other

## 2021-06-17 DIAGNOSIS — R509 Fever, unspecified: Secondary | ICD-10-CM | POA: Insufficient documentation

## 2021-06-17 DIAGNOSIS — R300 Dysuria: Secondary | ICD-10-CM | POA: Diagnosis not present

## 2021-06-17 DIAGNOSIS — Z87891 Personal history of nicotine dependence: Secondary | ICD-10-CM | POA: Insufficient documentation

## 2021-06-17 DIAGNOSIS — R112 Nausea with vomiting, unspecified: Secondary | ICD-10-CM | POA: Diagnosis not present

## 2021-06-17 DIAGNOSIS — R42 Dizziness and giddiness: Secondary | ICD-10-CM | POA: Insufficient documentation

## 2021-06-17 DIAGNOSIS — R1030 Lower abdominal pain, unspecified: Secondary | ICD-10-CM | POA: Diagnosis not present

## 2021-06-17 LAB — COMPREHENSIVE METABOLIC PANEL
ALT: 10 U/L (ref 0–44)
AST: 13 U/L — ABNORMAL LOW (ref 15–41)
Albumin: 3.7 g/dL (ref 3.5–5.0)
Alkaline Phosphatase: 39 U/L (ref 38–126)
Anion gap: 6 (ref 5–15)
BUN: 13 mg/dL (ref 6–20)
CO2: 27 mmol/L (ref 22–32)
Calcium: 9 mg/dL (ref 8.9–10.3)
Chloride: 104 mmol/L (ref 98–111)
Creatinine, Ser: 0.7 mg/dL (ref 0.44–1.00)
GFR, Estimated: >60 mL/min (ref >60)
Glucose, Bld: 94 mg/dL (ref 70–99)
Potassium: 4 mmol/L (ref 3.5–5.1)
Sodium: 137 mmol/L (ref 135–145)
Total Bilirubin: 0.3 mg/dL (ref 0.3–1.2)
Total Protein: 6.7 g/dL (ref 6.5–8.1)

## 2021-06-17 LAB — URINALYSIS, ROUTINE W REFLEX MICROSCOPIC
Bilirubin Urine: NEGATIVE
Glucose, UA: NEGATIVE mg/dL
Hgb urine dipstick: NEGATIVE
Ketones, ur: NEGATIVE mg/dL
Leukocytes,Ua: NEGATIVE
Nitrite: NEGATIVE
Protein, ur: NEGATIVE mg/dL
Specific Gravity, Urine: 1.01 (ref 1.005–1.030)
pH: 7.5 (ref 5.0–8.0)

## 2021-06-17 LAB — CBC WITH DIFFERENTIAL/PLATELET
Abs Immature Granulocytes: 0 10*3/uL (ref 0.00–0.07)
Basophils Absolute: 0 10*3/uL (ref 0.0–0.1)
Basophils Relative: 0 %
Eosinophils Absolute: 0.2 10*3/uL (ref 0.0–0.5)
Eosinophils Relative: 4 %
HCT: 38.6 % (ref 36.0–46.0)
Hemoglobin: 13.3 g/dL (ref 12.0–15.0)
Immature Granulocytes: 0 %
Lymphocytes Relative: 37 %
Lymphs Abs: 1.8 10*3/uL (ref 0.7–4.0)
MCH: 32.8 pg (ref 26.0–34.0)
MCHC: 34.5 g/dL (ref 30.0–36.0)
MCV: 95.3 fL (ref 80.0–100.0)
Monocytes Absolute: 0.4 10*3/uL (ref 0.1–1.0)
Monocytes Relative: 9 %
Neutro Abs: 2.5 10*3/uL (ref 1.7–7.7)
Neutrophils Relative %: 50 %
Platelets: 228 10*3/uL (ref 150–400)
RBC: 4.05 MIL/uL (ref 3.87–5.11)
RDW: 11.9 % (ref 11.5–15.5)
WBC: 4.9 10*3/uL (ref 4.0–10.5)
nRBC: 0 % (ref 0.0–0.2)

## 2021-06-17 LAB — LIPASE, BLOOD: Lipase: 27 U/L (ref 11–51)

## 2021-06-17 LAB — PREGNANCY, URINE: Preg Test, Ur: NEGATIVE

## 2021-06-17 MED ORDER — ACETAMINOPHEN 325 MG PO TABS
650.0000 mg | ORAL_TABLET | Freq: Once | ORAL | Status: AC
  Administered 2021-06-17: 650 mg via ORAL
  Filled 2021-06-17: qty 2

## 2021-06-17 MED ORDER — IOHEXOL 350 MG/ML SOLN
100.0000 mL | Freq: Once | INTRAVENOUS | Status: AC | PRN
  Administered 2021-06-17: 100 mL via INTRAVENOUS

## 2021-06-17 MED ORDER — ONDANSETRON 4 MG PO TBDP: 4.0000 mg | ORAL_TABLET | Freq: Three times a day (TID) | ORAL | 0 refills | Status: DC | PRN

## 2021-06-17 MED ORDER — ONDANSETRON HCL 4 MG/2ML IJ SOLN
4.0000 mg | Freq: Once | INTRAMUSCULAR | Status: AC
  Administered 2021-06-17: 4 mg via INTRAVENOUS
  Filled 2021-06-17: qty 2

## 2021-06-17 MED ORDER — PHENAZOPYRIDINE HCL 200 MG PO TABS: 200.0000 mg | ORAL_TABLET | Freq: Three times a day (TID) | ORAL | 0 refills | Status: AC

## 2021-06-17 NOTE — ED Triage Notes (Signed)
Pt C/O lower abdominal/vaginal pain for a week. Pt reports she figured it was a UTI and has been to OB twice this week at [facility], she states they followed up with an transvaginal ultrasound, urine test for UTI and pregnancy and all was negative. Pt reports pain is worse when she tries to urinate and states she feels she is not emptying her bladder. Pt also c/o  N/V/D and dizziness.

## 2021-06-17 NOTE — ED Provider Notes (Signed)
[facility] EMERGENCY DEPARTMENT Provider Note   CSN: [id number] Arrival date & time: 06/17/21  2585     History Chief Complaint  Patient presents with   Abdominal Pain    Melanie Pineda is a 35 y.o. female who presents the emergency department today for lower abdominal pain that began approximately 1 week ago.  She states that she was seen and evaluated by gynecology at [facility] yesterday.  Pelvic ultrasound, UA with culture, pregnancy, and pelvic exam were performed and were all negative per the patient and per old records.  Her lower abdominal pain has been progressively worsening, initially starting intermittent and has now become constant.  Abdominal pain radiates to the back.  She reports associated nausea, vomiting, dysuria, urinary urgency, dizziness, fever, and chills.  She denies hematuria, blood in her stool, chest pain, shortness of breath, leg pain, leg swelling, hematemesis, and syncope.  The history is provided by the patient. No language interpreter was used.  Abdominal Pain     Past Medical History:  Diagnosis Date   Anemia    Bipolar 1 disorder (HCC)    IBS (irritable bowel syndrome)    Reflux esophagitis     There are no problems to display for this patient.   History reviewed. No pertinent surgical history.   OB History     Gravida  3   Para  2   Term  2   Preterm      AB      Living  2      SAB      IAB      Ectopic      Multiple      Live Births  1           No family history on file.  Social History   Tobacco Use   Smoking status: Former   Smokeless tobacco: Never  Building services engineer Use: Never used  Substance Use Topics   Alcohol use: Yes    Comment: occ   Drug use: Yes    Types: Marijuana    Home Medications Prior to Admission medications   Medication Sig Start Date End Date Taking? Authorizing Provider  ondansetron (ZOFRAN ODT) 4 MG disintegrating tablet Take 1 tablet (4 mg total) by mouth  every 8 (eight) hours as needed for nausea or vomiting. 06/17/21  Yes Meredeth Ide, Mortimer Bair M, PA-C  phenazopyridine (PYRIDIUM) 200 MG tablet Take 1 tablet (200 mg total) by mouth 3 (three) times daily. 06/17/21  Yes Meredeth Ide, Lenn Volker M, PA-C  clonazePAM (KLONOPIN) 1 MG tablet Take 1 mg by mouth 2 (two) times daily.    [provider]  cyclobenzaprine (FLEXERIL) 10 MG tablet Take 1 tablet (10 mg total) by mouth 2 (two) times daily as needed for muscle spasms. 01/19/21   Mannie Stabile, PA-C  esomeprazole (NEXIUM) 40 MG capsule Take by mouth. 06/17/20   [provider]  HYDROcodone-acetaminophen (NORCO/VICODIN) 5-325 MG tablet Take 1 tablet by mouth every 6 (six) hours as needed for severe pain. 08/04/20   Gailen Shelter, PA  ibuprofen (ADVIL) 800 MG tablet Take 1 tablet (800 mg total) by mouth 3 (three) times daily. 01/19/21   Mannie Stabile, PA-C  lamoTRIgine (LAMICTAL) 100 MG tablet Take 100 mg by mouth daily.  04/26/20   [provider]  loperamide (IMODIUM) 2 MG capsule Take 1 capsule (2 mg total) by mouth 4 (four) times daily as needed for diarrhea or loose  stools. 11/17/20   Arthor CaptainHarris, Abigail, PA-C  metroNIDAZOLE (FLAGYL) 500 MG tablet Take 500 mg by mouth 2 (two) times daily. 07/07/20   [provider]  ondansetron (ZOFRAN ODT) 4 MG disintegrating tablet Take 1 tablet (4 mg total) by mouth every 8 (eight) hours as needed for nausea or vomiting. 08/04/20   Fondaw, Rodrigo RanWylder S, PA  ondansetron (ZOFRAN) 4 MG tablet Take 1 tablet (4 mg total) by mouth every 8 (eight) hours as needed for nausea or vomiting. 11/17/20   Arthor CaptainHarris, Abigail, PA-C  pantoprazole (PROTONIX) 40 MG tablet Take 1 tablet (40 mg total) by mouth daily. 06/03/20 07/03/20  Long, Arlyss RepressJoshua G, MD  sucralfate (CARAFATE) 1 g tablet Take 1 tablet (1 g total) by mouth 4 (four) times daily -  with meals and at bedtime. 06/03/20   Long, Arlyss RepressJoshua G, MD    Allergies    Ultram [tramadol], Bactrim [sulfamethoxazole-trimethoprim],  Progesterone, and Sulfa antibiotics  Review of Systems   Review of Systems  Gastrointestinal:  Positive for abdominal pain.  All other systems reviewed and are negative.  Physical Exam Updated Vital Signs BP 103/70   Pulse 62   Temp 98.6 F (37 C) (Oral)   Resp 15   Ht 5\' 6"  (1.676 m)   Wt 73.5 kg   LMP 05/29/2021   SpO2 99%   BMI 26.15 kg/m   Physical Exam Constitutional:      General: She is not in acute distress.    Appearance: Normal appearance.  HENT:     Head: Normocephalic and atraumatic.  Eyes:     General:        Right eye: No discharge.        Left eye: No discharge.  Cardiovascular:     Comments: Regular rate and rhythm.  S1/S2 are distinct without any evidence of murmur, rubs, or gallops.  Radial pulses are 2+ bilaterally.  Dorsalis pedis pulses are 2+ bilaterally.  No evidence of pedal edema. Pulmonary:     Comments: Clear to auscultation bilaterally.  Normal effort.  No respiratory distress.  No evidence of wheezes, rales, or rhonchi heard throughout. Abdominal:     General: Abdomen is flat. Bowel sounds are normal. There is no distension.     Tenderness: There is no guarding or rebound.     Comments: There is moderate tenderness to light palpation over the lower abdomen.  Negative peritoneal signs.  Musculoskeletal:        General: Normal range of motion.     Cervical back: Neck supple.  Skin:    General: Skin is warm and dry.     Findings: No rash.  Neurological:     General: No focal deficit present.     Mental Status: She is alert.  Psychiatric:        Mood and Affect: Mood normal.        Behavior: Behavior normal.     ED Results / Procedures / Treatments   Labs (all labs ordered are listed, but only abnormal results are displayed) Labs Reviewed  COMPREHENSIVE METABOLIC PANEL - Abnormal; Notable for the following components:      Result Value   AST 13 (*)    All other components within normal limits  LIPASE, BLOOD  CBC WITH  DIFFERENTIAL/PLATELET  PREGNANCY, URINE  URINALYSIS, ROUTINE W REFLEX MICROSCOPIC    EKG None  Radiology CT ABDOMEN PELVIS W CONTRAST  Result Date: 06/17/2021 CLINICAL DATA:  Acute lower abdominal pain. EXAM: CT ABDOMEN AND PELVIS  WITH CONTRAST TECHNIQUE: Multidetector CT imaging of the abdomen and pelvis was performed using the standard protocol following bolus administration of intravenous contrast. CONTRAST:  OMNIPAQUE IOHEXOL 350 MG/ML SOLN COMPARISON:  January 08, 2021. FINDINGS: Lower chest: No acute abnormality. Hepatobiliary: No gallstones or biliary dilatation is noted. Hepatic steatosis is noted. Pancreas: Unremarkable. No pancreatic ductal dilatation or surrounding inflammatory changes. Spleen: Normal in size without focal abnormality. Adrenals/Urinary Tract: Adrenal glands are unremarkable. Kidneys are normal, without renal calculi, focal lesion, or hydronephrosis. Bladder is unremarkable. Stomach/Bowel: Stomach is within normal limits. Appendix appears normal. No evidence of bowel wall thickening, distention, or inflammatory changes. Vascular/Lymphatic: No significant vascular findings are present. No enlarged abdominal or pelvic lymph nodes. Reproductive: Uterus and bilateral adnexa are unremarkable. Other: No abdominal wall hernia or abnormality. No abdominopelvic ascites. Musculoskeletal: No acute or significant osseous findings. IMPRESSION: Hepatic steatosis. No other significant abnormality seen in the abdomen or pelvis. Electronically Signed   By: Lupita Raider M.D.   On: 06/17/2021 12:20    Procedures Procedures   Medications Ordered in ED Medications  ondansetron (ZOFRAN) injection 4 mg (4 mg Intravenous Given 06/17/21 1039)  acetaminophen (TYLENOL) tablet 650 mg (650 mg Oral Given 06/17/21 1038)  iohexol (OMNIPAQUE) 350 MG/ML injection 100 mL (100 mLs Intravenous Contrast Given 06/17/21 1126)    ED Course  I have reviewed the triage vital signs and the nursing  notes.  Pertinent labs & imaging results that were available during my care of the patient were reviewed by me and considered in my medical decision making (see chart for details).    MDM Rules/Calculators/A&P                          Melanie Pineda is a 35 year old female who presents to the emergency department for evaluation of lower abdominal pain. Physical exam without peritoneal signs. No evidence of acute abdomen at this time. Well appearing. Patient was seen by gynecology yesterday where she had a pelvic exam performed which was normal per chart review. I do not feel that repeating a pelvic exam would be of high yield. STD testing was negative along with pelvic ultrasound. I am less concerned with PID or TOA. I have a low suspicion for ovarian torsion.  Presentation is not consistent with other acute/emergent causes of abdominal pain at this current moment.  CBC and CMP are within normal limits. Lipase was negative. CT abdomen/pelvis was negative. I have low suspicion for pancreatitis. I doubt hepatobiliary pathology including cholecystitis.  I have low suspicion for appendicitis, vascular catastrophe, bowel obstruction, and diverticulitis.  At this point, it is unclear the exact cause of her abdominal pain and urinary complaints.  We have ruled out all of the emergent causes in the emergency department today.  Will have her follow-up with her primary care provider for further evaluation.  I will prescribe a short course of Pyridium for vesicular spasms and Zofran for nausea/vomiting.  Strict return precautions given.  All questions and concerns addressed.  She is hemodynamically stable and safe for discharge.  Final Clinical Impression(s) / ED Diagnoses Final diagnoses:  Lower abdominal pain    Rx / DC Orders ED Discharge Orders          Ordered    ondansetron (ZOFRAN ODT) 4 MG disintegrating tablet  Every 8 hours PRN        06/17/21 1237    phenazopyridine (PYRIDIUM) 200 MG tablet  3  times  daily        06/17/21 1237             Teressa Lower, PA-C 06/17/21 1629    Franne Forts, DO 06/18/21 [phone]

## 2021-06-17 NOTE — Discharge Instructions (Addendum)
You are seen and evaluated in the emergency department today for lower abdominal pain.  As we discussed, there is no emergent cause of your abdominal pain.  Would like you to schedule an appointment with your primary care provider for further evaluation.  Zofran as needed for nausea and Pyridium as needed for spasms.  Please return to the emergency department for worsening abdominal pain, fever, chills, worsening vomiting, urinating blood, or any other concerns you might have.

## 2022-10-17 ENCOUNTER — Encounter (HOSPITAL_BASED_OUTPATIENT_CLINIC_OR_DEPARTMENT_OTHER): Payer: Self-pay | Admitting: Emergency Medicine

## 2022-10-17 ENCOUNTER — Emergency Department (HOSPITAL_BASED_OUTPATIENT_CLINIC_OR_DEPARTMENT_OTHER)
Admission: EM | Admit: 2022-10-17 | Discharge: 2022-10-17 | Disposition: A | Discharge status: Home / Self Care | Payer: Medicaid Other | Attending: Emergency Medicine | Admitting: Emergency Medicine

## 2022-10-17 ENCOUNTER — Other Ambulatory Visit: Payer: Self-pay

## 2022-10-17 DIAGNOSIS — B338 Other specified viral diseases: Secondary | ICD-10-CM

## 2022-10-17 DIAGNOSIS — R197 Diarrhea, unspecified: Secondary | ICD-10-CM | POA: Insufficient documentation

## 2022-10-17 DIAGNOSIS — J069 Acute upper respiratory infection, unspecified: Secondary | ICD-10-CM | POA: Insufficient documentation

## 2022-10-17 DIAGNOSIS — R112 Nausea with vomiting, unspecified: Secondary | ICD-10-CM | POA: Insufficient documentation

## 2022-10-17 DIAGNOSIS — M542 Cervicalgia: Secondary | ICD-10-CM | POA: Insufficient documentation

## 2022-10-17 DIAGNOSIS — Z1152 Encounter for screening for COVID-19: Secondary | ICD-10-CM | POA: Diagnosis not present

## 2022-10-17 DIAGNOSIS — B974 Respiratory syncytial virus as the cause of diseases classified elsewhere: Secondary | ICD-10-CM | POA: Diagnosis not present

## 2022-10-17 DIAGNOSIS — R059 Cough, unspecified: Secondary | ICD-10-CM | POA: Diagnosis present

## 2022-10-17 LAB — RESP PANEL BY RT-PCR (RSV, FLU A&B, COVID)  RVPGX2
Influenza A by PCR: NEGATIVE
Influenza B by PCR: NEGATIVE
Resp Syncytial Virus by PCR: POSITIVE — AB
SARS Coronavirus 2 by RT PCR: NEGATIVE

## 2022-10-17 MED ORDER — CETIRIZINE HCL 10 MG PO TABS: 10.0000 mg | ORAL_TABLET | Freq: Every day | ORAL | 0 refills | Status: AC

## 2022-10-17 MED ORDER — ONDANSETRON 4 MG PO TBDP: 4.0000 mg | ORAL_TABLET | Freq: Three times a day (TID) | ORAL | 0 refills | Status: AC | PRN

## 2022-10-17 MED ORDER — GUAIFENESIN 100 MG/5ML PO LIQD: 100.0000 mg | ORAL | 0 refills | Status: AC | PRN

## 2022-10-17 NOTE — ED Provider Notes (Signed)
[facility] EMERGENCY DEPARTMENT Provider Note   CSN: [id number] Arrival date & time: 10/17/22  1055     History  Chief Complaint  Patient presents with   Cough    Melanie Pineda is a 37 y.o. female with Hx of IBS, GERD, anemia, and bipolar disorder presenting to the ED with 3 to 5 days of URI symptoms.  Initially started with sinus congestion, runny nose, and bodyaches, developed to include sinus headaches and sore throat.  Now also with mild diarrhea, nausea, and 1 2 episodes of vomiting.  Has been taking Zofran to count per keep down food and fluids with some success.  Also reports some neck muscle tenderness over the last 3 to 4 days.  No recent injury.  Recent sick contacts at work, works as a Patent attorney.  Denies chest pain, shortness of breath, or chest discomfort.  No cardiac history.  No hx of asthma or COPD.  The history is provided by the patient and medical records.  Cough     Home Medications Prior to Admission medications   Medication Sig Start Date End Date Taking? Authorizing Provider  cetirizine (ZYRTEC ALLERGY) 10 MG tablet Take 1 tablet (10 mg total) by mouth daily. 10/17/22  Yes Cecil Cobbs, PA-C  guaiFENesin (ROBITUSSIN) 100 MG/5ML liquid Take 5-10 mLs (100-200 mg total) by mouth every 4 (four) hours as needed for cough or to loosen phlegm. 10/17/22  Yes Cecil Cobbs, PA-C  ondansetron (ZOFRAN-ODT) 4 MG disintegrating tablet Take 1 tablet (4 mg total) by mouth every 8 (eight) hours as needed for nausea or vomiting. 10/17/22  Yes Cecil Cobbs, PA-C  clonazePAM (KLONOPIN) 1 MG tablet Take 1 mg by mouth 2 (two) times daily.    [provider]  cyclobenzaprine (FLEXERIL) 10 MG tablet Take 1 tablet (10 mg total) by mouth 2 (two) times daily as needed for muscle spasms. 01/19/21   Mannie Stabile, PA-C  esomeprazole (NEXIUM) 40 MG capsule Take by mouth. 06/17/20   [provider]  HYDROcodone-acetaminophen (NORCO/VICODIN) 5-325 MG  tablet Take 1 tablet by mouth every 6 (six) hours as needed for severe pain. 08/04/20   Gailen Shelter, PA  ibuprofen (ADVIL) 800 MG tablet Take 1 tablet (800 mg total) by mouth 3 (three) times daily. 01/19/21   Mannie Stabile, PA-C  lamoTRIgine (LAMICTAL) 100 MG tablet Take 100 mg by mouth daily.  04/26/20   [provider]  loperamide (IMODIUM) 2 MG capsule Take 1 capsule (2 mg total) by mouth 4 (four) times daily as needed for diarrhea or loose stools. 11/17/20   Arthor Captain, PA-C  metroNIDAZOLE (FLAGYL) 500 MG tablet Take 500 mg by mouth 2 (two) times daily. 07/07/20   [provider]  ondansetron (ZOFRAN) 4 MG tablet Take 1 tablet (4 mg total) by mouth every 8 (eight) hours as needed for nausea or vomiting. 11/17/20   Arthor Captain, PA-C  pantoprazole (PROTONIX) 40 MG tablet Take 1 tablet (40 mg total) by mouth daily. 06/03/20 07/03/20  Long, Arlyss Repress, MD  phenazopyridine (PYRIDIUM) 200 MG tablet Take 1 tablet (200 mg total) by mouth 3 (three) times daily. 06/17/21   Honor Loh M, PA-C  sucralfate (CARAFATE) 1 g tablet Take 1 tablet (1 g total) by mouth 4 (four) times daily -  with meals and at bedtime. 06/03/20   Long, Arlyss Repress, MD      Allergies    Ultram [tramadol], Bactrim [sulfamethoxazole-trimethoprim], Progesterone, and Sulfa antibiotics  Review of Systems   Review of Systems  Respiratory:  Positive for cough.     Physical Exam Updated Vital Signs BP 106/78   Pulse (!) 128   Temp 98.5 F (36.9 C) (Oral)   Resp 20   Ht 5\' 6"  (1.676 m)   Wt 81.6 kg   LMP 10/10/2022   SpO2 97%   BMI 29.05 kg/m  Physical Exam Vitals and nursing note reviewed.  Constitutional:      General: She is not in acute distress.    Appearance: She is well-developed. She is not ill-appearing, toxic-appearing or diaphoretic.  HENT:     Head: Normocephalic and atraumatic.     Right Ear: Tympanic membrane, ear canal and external ear normal.     Left Ear: Tympanic membrane,  ear canal and external ear normal.     Nose: Congestion and rhinorrhea present.     Mouth/Throat:     Mouth: Mucous membranes are moist.     Pharynx: Oropharynx is clear.     Comments: Airway patent.  Mild erythema and mild exudate of posterior oropharynx.  No tonsillar exudate, swelling, erythema, or abscess.  Uvula without swelling, midline.  Handling salivary secretions without difficulty. Eyes:     Conjunctiva/sclera: Conjunctivae normal.  Neck:     Comments: Mild paraspinal tenderness C3-C7.  No midline tenderness.  Negative Brudzinski and Kernig's.  Very supple on exam.  No meningismus or torticollis. Cardiovascular:     Rate and Rhythm: Normal rate and regular rhythm.     Pulses: Normal pulses.     Heart sounds: Normal heart sounds. No murmur heard. Pulmonary:     Effort: Pulmonary effort is normal. No respiratory distress.     Breath sounds: Normal breath sounds.     Comments: CTAB, without increased respiratory effort, able to communicate without difficulty.  Adequate air movement. Chest:     Chest wall: No tenderness.  Abdominal:     Palpations: Abdomen is soft.     Tenderness: There is no abdominal tenderness.  Musculoskeletal:        General: No swelling.     Cervical back: Neck supple. No rigidity.  Skin:    General: Skin is warm and dry.     Capillary Refill: Capillary refill takes less than 2 seconds.     Coloration: Skin is not jaundiced or pale.  Neurological:     Mental Status: She is alert and oriented to person, place, and time.     Sensory: No sensory deficit.     Motor: No weakness.     Coordination: Coordination normal.     Gait: Gait normal.     Comments: GCS 15  Psychiatric:        Mood and Affect: Mood normal.     ED Results / Procedures / Treatments   Labs (all labs ordered are listed, but only abnormal results are displayed) Labs Reviewed  RESP PANEL BY RT-PCR (RSV, FLU A&B, COVID)  RVPGX2 - Abnormal; Notable for the following components:       Result Value   Resp Syncytial Virus by PCR POSITIVE (*)    All other components within normal limits   EKG None  Radiology No results found.  Procedures Procedures    Medications Ordered in ED Medications - No data to display  ED Course/ Medical Decision Making/ A&P Clinical Course as of 10/17/22 1311  Tue Oct 17, 2022  1239 Pulse Rate(!): 128 HR observed at 85 bpm on exam. [AC]  1241 Respiratory Syncytial Virus by PCR(!): POSITIVE [AC]    Clinical Course User Index [AC] Prince Rome, PA-C                           Medical Decision Making  With Hx of bipolar, IBS, GERD presenting to the ED with 3 to 5 days of worsening URI symptoms.  No Hx of asthma or COPD.  Recent sick contacts at work, works as a Community education officer.  Symptoms include nasal congestion, sinus pressure, sinus headaches, mild sore throat, productive cough, myalgias, nausea, 1-2 episodes of vomiting, 1-2 episodes of diarrhea.  Pt well-appearing on exam.  Sitting comfortably, AAOx4.  Pt afebrile without tonsillar exudate, erythema, or swelling.  Low clinical suspicion for strep pharyngitis.  Satting at 97-99% on room air.  Hemodynamically stable.  Good air movement without wheezing or rales.  No accessory muscle use, tripoding, drooling, nasal flaring, or retractions appreciated.  No Hx of asthma or COPD.  Considered CXR however low suspicion for pneumonia at this time.  Without cervical lymphadenopathy or dysphagia.  Presentation non concerning for PTA or RPA.  No trismus or uvula deviation.  Pt able to drink water in ED without difficulty with intact air way.   Low clinical suspicion for otitis media or otitis externa or mastoiditis at this time.  No meningismus or torticollis.  Negative Brudzinski and Kernig.  Doubt meningitis.  Tested positive for RSV.  Clinical diagnosis of viral pharyngitis.  No abx indicated.  Pt does not appear dehydrated, but did discuss importance of water rehydration.  Recommended PCP  follow up and conservative symptom management.  Prescription sent to pharmacy.  Patient reports satisfaction with today's encounter.  Patient in NAD and in good condition at time of discharge.         Final Clinical Impression(s) / ED Diagnoses Final diagnoses:  Viral URI with cough  RSV infection    Rx / DC Orders ED Discharge Orders          Ordered    ondansetron (ZOFRAN-ODT) 4 MG disintegrating tablet  Every 8 hours PRN        10/17/22 1304    guaiFENesin (ROBITUSSIN) 100 MG/5ML liquid  Every 4 hours PRN        10/17/22 1304    cetirizine (ZYRTEC ALLERGY) 10 MG tablet  Daily        10/17/22 1304              Prince Rome, [place] [birth year] 1311    Long, Wonda Olds, MD 10/19/22 1651

## 2022-10-17 NOTE — Discharge Instructions (Addendum)
You were seen in the emergency department today for upper respiratory infection, body aches, sinus headaches.  You  have tested positive for RSV.  Treatment is directed at relieving symptoms.  There is no cure for acute viral infections; it is usually best to let them run their course.  Symptoms usually last around 1-2 weeks, though cough Millstein occasionally linger for up to 3-4 weeks.  As discussed, antibiotics are not effective with viral infections, because the infection is caused by a virus, not by bacteria.   Treatments [place] include:  Increased fluid intake. Sports drinks offer valuable electrolytes, sugars, and fluids.  Breathing heated mist or steam (vaporizer or shower).  Eating chicken soup or other clear broths, and maintaining good nutrition.  Getting plenty of rest.  Using lozenges, tea with honey, or warm salt water for cough relief/sore throat relief (Cepkaol or Halls lozenges are available over-the-counter) Increasing usage of your inhaler if you have asthma.   Take over-the-counter Benadryl, Zyrtec, or Claritin to decrease sinus secretions, with Mucinex to help break up remaining mucus Continue to alternate between Tylenol and ibuprofen for pain and fever control.  You Spaid return to work 24 hours after your temperature has returned to normal.  Please follow up with your primary care doctor in 5-7 days for recheck of ongoing symptoms.  Return to emergency department for emergent changing or worsening of symptoms.

## 2022-10-17 NOTE — ED Triage Notes (Signed)
Pt c/o cough, HAs, diarrhea, body aches since yesterday

## 2022-10-24 ENCOUNTER — Encounter (HOSPITAL_BASED_OUTPATIENT_CLINIC_OR_DEPARTMENT_OTHER): Payer: Self-pay

## 2022-10-24 ENCOUNTER — Other Ambulatory Visit: Payer: Self-pay

## 2022-10-24 ENCOUNTER — Emergency Department (HOSPITAL_BASED_OUTPATIENT_CLINIC_OR_DEPARTMENT_OTHER)
Admission: EM | Admit: 2022-10-24 | Discharge: 2022-10-24 | Disposition: A | Discharge status: Home / Self Care | Payer: Medicaid Other | Attending: Emergency Medicine | Admitting: Emergency Medicine

## 2022-10-24 DIAGNOSIS — S161XXA Strain of muscle, fascia and tendon at neck level, initial encounter: Secondary | ICD-10-CM | POA: Diagnosis not present

## 2022-10-24 DIAGNOSIS — Y92410 Unspecified street and highway as the place of occurrence of the external cause: Secondary | ICD-10-CM | POA: Diagnosis not present

## 2022-10-24 DIAGNOSIS — M546 Pain in thoracic spine: Secondary | ICD-10-CM | POA: Diagnosis present

## 2022-10-24 MED ORDER — NAPROXEN 375 MG PO TABS: 375.0000 mg | ORAL_TABLET | Freq: Two times a day (BID) | ORAL | 0 refills | Status: AC

## 2022-10-24 MED ORDER — METHOCARBAMOL 500 MG PO TABS: 500.0000 mg | ORAL_TABLET | Freq: Two times a day (BID) | ORAL | 0 refills | Status: AC
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
## 2022-10-24 NOTE — ED Notes (Signed)
Pt ambulatory to room and BR

## 2022-11-30 ENCOUNTER — Other Ambulatory Visit: Payer: Self-pay

## 2022-11-30 ENCOUNTER — Emergency Department (HOSPITAL_BASED_OUTPATIENT_CLINIC_OR_DEPARTMENT_OTHER)
Admission: EM | Admit: 2022-11-30 | Discharge: 2022-11-30 | Disposition: A | Discharge status: Home / Self Care | Payer: Medicaid Other | Attending: Emergency Medicine | Admitting: Emergency Medicine

## 2022-11-30 ENCOUNTER — Emergency Department (HOSPITAL_BASED_OUTPATIENT_CLINIC_OR_DEPARTMENT_OTHER): Payer: Medicaid Other

## 2022-11-30 ENCOUNTER — Encounter (HOSPITAL_BASED_OUTPATIENT_CLINIC_OR_DEPARTMENT_OTHER): Payer: Self-pay | Admitting: Emergency Medicine

## 2022-11-30 DIAGNOSIS — R1013 Epigastric pain: Secondary | ICD-10-CM | POA: Insufficient documentation

## 2022-11-30 DIAGNOSIS — R112 Nausea with vomiting, unspecified: Secondary | ICD-10-CM | POA: Diagnosis not present

## 2022-11-30 DIAGNOSIS — R197 Diarrhea, unspecified: Secondary | ICD-10-CM | POA: Diagnosis not present

## 2022-11-30 DIAGNOSIS — E871 Hypo-osmolality and hyponatremia: Secondary | ICD-10-CM | POA: Insufficient documentation

## 2022-11-30 DIAGNOSIS — R109 Unspecified abdominal pain: Secondary | ICD-10-CM

## 2022-11-30 LAB — COMPREHENSIVE METABOLIC PANEL
ALT: 12 U/L (ref 0–44)
AST: 16 U/L (ref 15–41)
Albumin: 4 g/dL (ref 3.5–5.0)
Alkaline Phosphatase: 53 U/L (ref 38–126)
Anion gap: 8 (ref 5–15)
BUN: 8 mg/dL (ref 6–20)
CO2: 22 mmol/L (ref 22–32)
Calcium: 8.8 mg/dL — ABNORMAL LOW (ref 8.9–10.3)
Chloride: 103 mmol/L (ref 98–111)
Creatinine, Ser: 0.75 mg/dL (ref 0.44–1.00)
GFR, Estimated: >60 mL/min (ref >60)
Glucose, Bld: 101 mg/dL — ABNORMAL HIGH (ref 70–99)
Potassium: 3.5 mmol/L (ref 3.5–5.1)
Sodium: 133 mmol/L — ABNORMAL LOW (ref 135–145)
Total Bilirubin: 0.7 mg/dL (ref 0.3–1.2)
Total Protein: 7.9 g/dL (ref 6.5–8.1)

## 2022-11-30 LAB — URINALYSIS, MICROSCOPIC (REFLEX)

## 2022-11-30 LAB — CBC
HCT: 40.8 % (ref 36.0–46.0)
Hemoglobin: 14.3 g/dL (ref 12.0–15.0)
MCH: 32.5 pg (ref 26.0–34.0)
MCHC: 35 g/dL (ref 30.0–36.0)
MCV: 92.7 fL (ref 80.0–100.0)
Platelets: 314 10*3/uL (ref 150–400)
RBC: 4.4 MIL/uL (ref 3.87–5.11)
RDW: 12.3 % (ref 11.5–15.5)
WBC: 5.8 10*3/uL (ref 4.0–10.5)
nRBC: 0 % (ref 0.0–0.2)

## 2022-11-30 LAB — PREGNANCY, URINE: Preg Test, Ur: NEGATIVE

## 2022-11-30 LAB — URINALYSIS, ROUTINE W REFLEX MICROSCOPIC
Bilirubin Urine: NEGATIVE
Glucose, UA: NEGATIVE mg/dL
Ketones, ur: NEGATIVE mg/dL
Leukocytes,Ua: NEGATIVE
Nitrite: NEGATIVE
Protein, ur: NEGATIVE mg/dL
Specific Gravity, Urine: 1.015 (ref 1.005–1.030)
pH: 7.5 (ref 5.0–8.0)

## 2022-11-30 LAB — LIPASE, BLOOD: Lipase: 26 U/L (ref 11–51)

## 2022-11-30 MED ORDER — DICYCLOMINE HCL 20 MG PO TABS: 20.0000 mg | ORAL_TABLET | Freq: Two times a day (BID) | ORAL | 0 refills | Status: AC | PRN

## 2022-11-30 MED ORDER — IOHEXOL 300 MG/ML  SOLN
100.0000 mL | Freq: Once | INTRAMUSCULAR | Status: AC | PRN
  Administered 2022-11-30: 100 mL via INTRAVENOUS

## 2022-11-30 MED ORDER — MORPHINE SULFATE (PF) 4 MG/ML IV SOLN
4.0000 mg | Freq: Once | INTRAVENOUS | Status: AC
  Administered 2022-11-30: 4 mg via INTRAVENOUS
  Filled 2022-11-30: qty 1

## 2022-11-30 MED ORDER — ONDANSETRON HCL 4 MG/2ML IJ SOLN
4.0000 mg | Freq: Once | INTRAMUSCULAR | Status: DC | PRN
  Filled 2022-11-30: qty 2

## 2022-11-30 MED ORDER — OMEPRAZOLE 20 MG PO CPDR: 20.0000 mg | DELAYED_RELEASE_CAPSULE | Freq: Every day | ORAL | 0 refills | Status: AC

## 2022-11-30 MED ORDER — ONDANSETRON HCL 4 MG/2ML IJ SOLN
4.0000 mg | Freq: Once | INTRAMUSCULAR | Status: AC
  Administered 2022-11-30: 4 mg via INTRAVENOUS
  Filled 2022-11-30: qty 2

## 2022-11-30 MED ORDER — SODIUM CHLORIDE 0.9 % IV BOLUS
1000.0000 mL | Freq: Once | INTRAVENOUS | Status: AC
  Administered 2022-11-30: 1000 mL via INTRAVENOUS

## 2022-11-30 MED ORDER — PANTOPRAZOLE SODIUM 40 MG IV SOLR
40.0000 mg | Freq: Once | INTRAVENOUS | Status: AC
  Administered 2022-11-30: 40 mg via INTRAVENOUS
  Filled 2022-11-30: qty 10

## 2022-11-30 MED ORDER — ONDANSETRON 4 MG PO TBDP: 4.0000 mg | ORAL_TABLET | Freq: Three times a day (TID) | ORAL | 0 refills | Status: AC | PRN

## 2022-11-30 NOTE — ED Triage Notes (Signed)
Upper abdominal pain radiating through her back with N/V x 3 days.  No known fever.  Some diarrhea.  No dysuria.
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
## 2022-11-30 NOTE — Discharge Instructions (Signed)
  You have been seen in the Emergency Department (ED) for abdominal pain and vomiting. Your workup was generally reassuring; however, it did not identify a clear cause of your symptoms.  Please follow up with your primary care doctor as soon as possible regarding today's ED visit and the symptoms that are bothering you.  Return to the ED if your abdominal pain worsens or fails to improve, you develop bloody vomiting, bloody diarrhea, you are unable to tolerate fluids due to vomiting, fever greater than 101, or any other concerning symptoms.

## 2023-04-08 ENCOUNTER — Encounter (HOSPITAL_BASED_OUTPATIENT_CLINIC_OR_DEPARTMENT_OTHER): Payer: Self-pay

## 2023-04-08 ENCOUNTER — Other Ambulatory Visit: Payer: Self-pay

## 2023-04-08 ENCOUNTER — Emergency Department (HOSPITAL_BASED_OUTPATIENT_CLINIC_OR_DEPARTMENT_OTHER)
Admission: EM | Admit: 2023-04-08 | Discharge: 2023-04-08 | Discharge status: Against Medical Advice | Payer: Medicaid Other | Attending: Emergency Medicine | Admitting: Emergency Medicine

## 2023-04-08 DIAGNOSIS — R5383 Other fatigue: Secondary | ICD-10-CM | POA: Diagnosis not present

## 2023-04-08 DIAGNOSIS — Z5329 Procedure and treatment not carried out because of patient's decision for other reasons: Secondary | ICD-10-CM | POA: Insufficient documentation

## 2023-04-08 DIAGNOSIS — R509 Fever, unspecified: Secondary | ICD-10-CM | POA: Insufficient documentation

## 2023-04-08 DIAGNOSIS — R519 Headache, unspecified: Secondary | ICD-10-CM | POA: Diagnosis not present

## 2023-04-08 DIAGNOSIS — Z5321 Procedure and treatment not carried out due to patient leaving prior to being seen by health care provider: Secondary | ICD-10-CM

## 2023-04-08 DIAGNOSIS — H9201 Otalgia, right ear: Secondary | ICD-10-CM | POA: Insufficient documentation

## 2023-04-08 NOTE — ED Triage Notes (Addendum)
Pt states she woke up with rt. Ear pain and states it is painful to touch

## 2023-04-08 NOTE — ED Provider Notes (Signed)
[facility] Provider Note   CSN: [id number] Arrival date & time: 04/08/23  1458     History Chief Complaint  Patient presents with   Melanie Pineda is a 37 y.o. female reportedly otherwise healthy presents emerged from today for evaluation of right ear pain.  Patient reports that she is having ear pain off-and-on but worsened today.  She has been using her acrylic nails to clean her ears.  She denies any discharge from the ear.  She also reports that she has been feeling fatigued and has been having headaches towards the end of the day for the past few weeks to months..  No medication trialed for this.  Additionally, she is requesting IV fluids given that "all I drink is iced coffee, and I do not like water".  She is not having any nausea or vomiting or any abdominal pain.  She reports that she is been taking her temperature multiple times throughout the day for the past few weeks given her fatigue and is 100.4 Fahrenheit Tmax.  She does have a primary care doctor however is not followed up with her PCP about these issues.  Denies any belly pain, nausea, or vomiting.   Otalgia Associated symptoms: fever (Occasional) and headaches   Associated symptoms: no abdominal pain, no congestion, no ear discharge, no hearing loss, no rhinorrhea and no vomiting        Home Medications Prior to Admission medications   Medication Sig Start Date End Date Taking? Authorizing Provider  cetirizine (ZYRTEC ALLERGY) 10 MG tablet Take 1 tablet (10 mg total) by mouth daily. 10/17/22   Cecil Cobbs, PA-C  clonazePAM (KLONOPIN) 1 MG tablet Take 1 mg by mouth 2 (two) times daily.    [provider]  cyclobenzaprine (FLEXERIL) 10 MG tablet Take 1 tablet (10 mg total) by mouth 2 (two) times daily as needed for muscle spasms. 01/19/21   Mannie Stabile, PA-C  dicyclomine (BENTYL) 20 MG tablet Take 1 tablet (20 mg total) by mouth 2 (two) times  daily as needed (abdominal cramping). 11/30/22   Mardene Sayer, MD  esomeprazole (NEXIUM) 40 MG capsule Take by mouth. 06/17/20   [provider]  guaiFENesin (ROBITUSSIN) 100 MG/5ML liquid Take 5-10 mLs (100-200 mg total) by mouth every 4 (four) hours as needed for cough or to loosen phlegm. 10/17/22   Cecil Cobbs, PA-C  HYDROcodone-acetaminophen (NORCO/VICODIN) 5-325 MG tablet Take 1 tablet by mouth every 6 (six) hours as needed for severe pain. 08/04/20   Gailen Shelter, PA  lamoTRIgine (LAMICTAL) 100 MG tablet Take 100 mg by mouth daily.  04/26/20   [provider]  loperamide (IMODIUM) 2 MG capsule Take 1 capsule (2 mg total) by mouth 4 (four) times daily as needed for diarrhea or loose stools. 11/17/20   Arthor Captain, PA-C  methocarbamol (ROBAXIN) 500 MG tablet Take 1 tablet (500 mg total) by mouth 2 (two) times daily. 10/24/22   Karie Mainland, Amjad, PA-C  metroNIDAZOLE (FLAGYL) 500 MG tablet Take 500 mg by mouth 2 (two) times daily. 07/07/20   [provider]  naproxen (NAPROSYN) 375 MG tablet Take 1 tablet (375 mg total) by mouth 2 (two) times daily. 10/24/22   Marita Kansas, PA-C  omeprazole (PRILOSEC) 20 MG capsule Take 1 capsule (20 mg total) by mouth daily. 11/30/22   Mardene Sayer, MD  ondansetron (ZOFRAN) 4 MG tablet Take 1 tablet (4 mg total) by  mouth every 8 (eight) hours as needed for nausea or vomiting. 11/17/20   Arthor Captain, PA-C  ondansetron (ZOFRAN-ODT) 4 MG disintegrating tablet Take 1 tablet (4 mg total) by mouth every 8 (eight) hours as needed for nausea or vomiting. 10/17/22   Cecil Cobbs, PA-C  ondansetron (ZOFRAN-ODT) 4 MG disintegrating tablet Take 1 tablet (4 mg total) by mouth every 8 (eight) hours as needed for nausea or vomiting. 11/30/22   Mardene Sayer, MD  pantoprazole (PROTONIX) 40 MG tablet Take 1 tablet (40 mg total) by mouth daily. 06/03/20 07/03/20  Long, Arlyss Repress, MD  phenazopyridine (PYRIDIUM) 200 MG tablet Take 1 tablet  (200 mg total) by mouth 3 (three) times daily. 06/17/21   Honor Loh M, PA-C  sucralfate (CARAFATE) 1 g tablet Take 1 tablet (1 g total) by mouth 4 (four) times daily -  with meals and at bedtime. 06/03/20   Long, Arlyss Repress, MD      Allergies    Ultram [tramadol], Bactrim [sulfamethoxazole-trimethoprim], Progesterone, and Sulfa antibiotics    Review of Systems   Review of Systems  Constitutional:  Positive for fever (Occasional). Negative for chills.  HENT:  Positive for ear pain. Negative for congestion, ear discharge, hearing loss and rhinorrhea.   Respiratory:  Negative for shortness of breath.   Cardiovascular:  Negative for chest pain.  Gastrointestinal:  Negative for abdominal pain, nausea and vomiting.  Neurological:  Positive for headaches. Negative for syncope.    Physical Exam Updated Vital Signs BP 131/79 (BP Location: Left Arm)   Pulse 69   Temp 98.3 F (36.8 C) (Oral)   Resp 16   Ht 5\' 6"  (1.676 m)   Wt 81.6 kg   LMP 04/03/2023 (Exact Date)   SpO2 96%   BMI 29.05 kg/m  Physical Exam Vitals and nursing note reviewed.  Constitutional:      General: She is not in acute distress.    Appearance: Normal appearance. She is not ill-appearing or toxic-appearing.     Comments: On phone in no acute distress.  HENT:     Head:     Comments: Pain with manipulation of the pinna on the right.  No mastoid tenderness to palpation or any overlying skin changes to the mastoid.  Her inner ear canals have a linear excoriation marks.  TM is intact and well-appearing.  For the left ear, there is no pain with nucleation of the pinna.  No overlying mastoid changes or tenderness to the mastoids.  Again, ear canal shows linear expiration marks and TM is well-appearing    Right Ear: Tympanic membrane normal.     Left Ear: Tympanic membrane normal.  Eyes:     General: No scleral icterus. Pulmonary:     Effort: Pulmonary effort is normal. No respiratory distress.  Skin:    General: Skin  is dry.  Neurological:     General: No focal deficit present.     Mental Status: She is alert. Mental status is at baseline.     Gait: Gait normal.     ED Results / Procedures / Treatments   Labs (all labs ordered are listed, but only abnormal results are displayed) Labs Reviewed - No data to display  EKG None  Radiology No results found.  Procedures Procedures   Medications Ordered in ED Medications - No data to display  ED Course/ Medical Decision Making/ A&P  Medical Decision Making  37 y.o. female presents to the ER today for evaluation of ear pain, headache, fatigue. Differential diagnosis includes but is not limited to migraine, viral illness, dehydration, electrolyte abnormality, mastoiditis, otitis media, otitis externa. Vital signs unremarkable. Physical exam as noted above.   I informed the patient did not use her very sharp and pointy nails to clean out her ears as they are causing little scratches in her ear and she has risk of perforating her eardrums.  While interviewing the patient she was listening multiple complaints that appear to be chronic in nature and was demanding to know the answers of why she was feeling this way.  I advised the patient that we can do some blood work to check for her blood levels and electrolyte levels to check for her hydration however she is able to tolerate p.o. fluids, there is no need to give any IV fluids given that she is not having any nausea or vomiting.  I discussed with her that I [place] not be able to find out find out the exact reason why she is feeling this way however we can check for her electrolytes or any infections and give her medication for her headache, however she says she is not having a headache now.  She is demanding IV fluids because she does not like to drink water.  Additionally, she is requesting further workup for her symptoms.  I discussed with her that we can start with the blood work and  determine if there is any additional need for imaging at this time.  Again, I discussed with her that this is an emergency department and offered to do blood work to check for any irregularities and I can provide her some pain medication for her ear pain.  Patient became very agitated that I would not give her any IV fluids and eloped from the emergency department during the middle of our conversation and evaluation.  Portions of this report [place] have been transcribed using voice recognition software. Every effort was made to ensure accuracy; however, inadvertent computerized transcription errors Urias be present.   Final Clinical Impression(s) / ED Diagnoses Final diagnoses:  Eloped from emergency department    Rx / DC Orders ED Discharge Orders     None         Achille Rich, PA-C 04/11/23 1113    Arby Barrette, MD 04/17/23 1615

## 2023-07-30 ENCOUNTER — Encounter (HOSPITAL_BASED_OUTPATIENT_CLINIC_OR_DEPARTMENT_OTHER): Payer: Self-pay

## 2023-07-30 ENCOUNTER — Emergency Department (HOSPITAL_BASED_OUTPATIENT_CLINIC_OR_DEPARTMENT_OTHER)
Admission: EM | Admit: 2023-07-30 | Discharge: 2023-07-30 | Disposition: A | Discharge status: Home / Self Care | Payer: Medicaid Other | Attending: Emergency Medicine | Admitting: Emergency Medicine

## 2023-07-30 ENCOUNTER — Other Ambulatory Visit: Payer: Self-pay

## 2023-07-30 ENCOUNTER — Emergency Department (HOSPITAL_BASED_OUTPATIENT_CLINIC_OR_DEPARTMENT_OTHER): Payer: Medicaid Other

## 2023-07-30 DIAGNOSIS — Z1152 Encounter for screening for COVID-19: Secondary | ICD-10-CM | POA: Insufficient documentation

## 2023-07-30 DIAGNOSIS — R059 Cough, unspecified: Secondary | ICD-10-CM | POA: Diagnosis present

## 2023-07-30 DIAGNOSIS — J40 Bronchitis, not specified as acute or chronic: Secondary | ICD-10-CM | POA: Insufficient documentation

## 2023-07-30 LAB — RESP PANEL BY RT-PCR (RSV, FLU A&B, COVID)  RVPGX2
Influenza A by PCR: NEGATIVE
Influenza B by PCR: NEGATIVE
Resp Syncytial Virus by PCR: NEGATIVE
SARS Coronavirus 2 by RT PCR: NEGATIVE

## 2023-07-30 MED ORDER — BENZONATATE 100 MG PO CAPS: 100.0000 mg | ORAL_CAPSULE | Freq: Three times a day (TID) | ORAL | 0 refills | Status: AC

## 2023-07-30 MED ORDER — ONDANSETRON 4 MG PO TBDP
4.0000 mg | ORAL_TABLET | Freq: Once | ORAL | Status: AC
  Administered 2023-07-30: 4 mg via ORAL
  Filled 2023-07-30: qty 1

## 2023-07-30 NOTE — ED Triage Notes (Signed)
Pt reports coughing x a few weeks. States that last night was worse. Wants to be checked. Denies fever, No shortness of breath.

## 2023-07-30 NOTE — Discharge Instructions (Addendum)
Your chest x-ray shows no signs of a pneumonia.  I suspect your symptoms are secondary to to bronchitis which is typically viral. This does not require antibiotics. This needs to run its course, but usually starts to resolve by week 3.  I prescribed medication for cough called benzonatate (Tessalon).  You Shiffler take this up to every 8 hours as needed for cough.  Please follow-up with your PCP within the next week if symptoms have not started to improve.  Return to the ER for any chest pain, coughing up blood, fevers, any other new or concerning symptoms.

## 2023-07-30 NOTE — ED Provider Notes (Signed)
[facility] Provider Note   CSN: [id number] Arrival date & time: 07/30/23  1704     History  Chief Complaint  Patient presents with   Cough    Melanie Pineda is a 37 y.o. female with history of anemia, bipolar 1, IBS, presents with concern for a cough that has been ongoing for past 2 weeks.  States she has been coughing up a dark brown/reddish colored sputum.  Denies any fever or chills, chest pain.    Cough      Home Medications Prior to Admission medications   Medication Sig Start Date End Date Taking? Authorizing Provider  benzonatate (TESSALON) 100 MG capsule Take 1 capsule (100 mg total) by mouth every 8 (eight) hours. 07/30/23  Yes Arabella Merles, PA-C  cetirizine (ZYRTEC ALLERGY) 10 MG tablet Take 1 tablet (10 mg total) by mouth daily. 10/17/22   Cecil Cobbs, PA-C  clonazePAM (KLONOPIN) 1 MG tablet Take 1 mg by mouth 2 (two) times daily.    [provider]  cyclobenzaprine (FLEXERIL) 10 MG tablet Take 1 tablet (10 mg total) by mouth 2 (two) times daily as needed for muscle spasms. 01/19/21   Mannie Stabile, PA-C  dicyclomine (BENTYL) 20 MG tablet Take 1 tablet (20 mg total) by mouth 2 (two) times daily as needed (abdominal cramping). 11/30/22   Mardene Sayer, MD  esomeprazole (NEXIUM) 40 MG capsule Take by mouth. 06/17/20   [provider]  guaiFENesin (ROBITUSSIN) 100 MG/5ML liquid Take 5-10 mLs (100-200 mg total) by mouth every 4 (four) hours as needed for cough or to loosen phlegm. 10/17/22   Cecil Cobbs, PA-C  HYDROcodone-acetaminophen (NORCO/VICODIN) 5-325 MG tablet Take 1 tablet by mouth every 6 (six) hours as needed for severe pain. 08/04/20   Gailen Shelter, PA  lamoTRIgine (LAMICTAL) 100 MG tablet Take 100 mg by mouth daily.  04/26/20   [provider]  loperamide (IMODIUM) 2 MG capsule Take 1 capsule (2 mg total) by mouth 4 (four) times daily as needed for diarrhea or loose  stools. 11/17/20   Arthor Captain, PA-C  methocarbamol (ROBAXIN) 500 MG tablet Take 1 tablet (500 mg total) by mouth 2 (two) times daily. 10/24/22   Karie Mainland, Amjad, PA-C  metroNIDAZOLE (FLAGYL) 500 MG tablet Take 500 mg by mouth 2 (two) times daily. 07/07/20   [provider]  naproxen (NAPROSYN) 375 MG tablet Take 1 tablet (375 mg total) by mouth 2 (two) times daily. 10/24/22   Marita Kansas, PA-C  omeprazole (PRILOSEC) 20 MG capsule Take 1 capsule (20 mg total) by mouth daily. 11/30/22   Mardene Sayer, MD  ondansetron (ZOFRAN) 4 MG tablet Take 1 tablet (4 mg total) by mouth every 8 (eight) hours as needed for nausea or vomiting. 11/17/20   Arthor Captain, PA-C  ondansetron (ZOFRAN-ODT) 4 MG disintegrating tablet Take 1 tablet (4 mg total) by mouth every 8 (eight) hours as needed for nausea or vomiting. 10/17/22   Cecil Cobbs, PA-C  ondansetron (ZOFRAN-ODT) 4 MG disintegrating tablet Take 1 tablet (4 mg total) by mouth every 8 (eight) hours as needed for nausea or vomiting. 11/30/22   Mardene Sayer, MD  pantoprazole (PROTONIX) 40 MG tablet Take 1 tablet (40 mg total) by mouth daily. 06/03/20 07/03/20  Long, Arlyss Repress, MD  phenazopyridine (PYRIDIUM) 200 MG tablet Take 1 tablet (200 mg total) by mouth 3 (three) times daily. 06/17/21   Honor Loh M, PA-C  sucralfate (  CARAFATE) 1 g tablet Take 1 tablet (1 g total) by mouth 4 (four) times daily -  with meals and at bedtime. 06/03/20   Long, Arlyss Repress, MD      Allergies    Ultram [tramadol], Bactrim [sulfamethoxazole-trimethoprim], Progesterone, and Sulfa antibiotics    Review of Systems   Review of Systems  Respiratory:  Positive for cough.     Physical Exam Updated Vital Signs BP 117/80 (BP Location: Left Arm)   Pulse 72   Temp 98.1 F (36.7 C)   Resp 18   Ht 5\' 6"  (1.676 m)   Wt 77.1 kg   LMP 07/30/2023   SpO2 97%   BMI 27.44 kg/m  Physical Exam Vitals and nursing note reviewed.  Constitutional:      General: She is not in  acute distress.    Appearance: She is well-developed.     Comments: Well-appearing, talking in full sentences  HENT:     Head: Normocephalic and atraumatic.  Eyes:     Conjunctiva/sclera: Conjunctivae normal.  Cardiovascular:     Rate and Rhythm: Normal rate and regular rhythm.     Heart sounds: No murmur heard. Pulmonary:     Effort: Pulmonary effort is normal. No respiratory distress.     Breath sounds: Normal breath sounds.  Abdominal:     Palpations: Abdomen is soft.     Tenderness: There is no abdominal tenderness.  Musculoskeletal:        General: No swelling.     Cervical back: Neck supple.  Skin:    General: Skin is warm and dry.     Capillary Refill: Capillary refill takes less than 2 seconds.  Neurological:     Mental Status: She is alert.  Psychiatric:        Mood and Affect: Mood normal.     ED Results / Procedures / Treatments   Labs (all labs ordered are listed, but only abnormal results are displayed) Labs Reviewed  RESP PANEL BY RT-PCR (RSV, FLU A&B, COVID)  [id number]    EKG None  Radiology DG Chest 2 View  Result Date: 07/30/2023 CLINICAL DATA:  Cough for 1 week. EXAM: CHEST - 2 VIEW COMPARISON:  January 08, 2021. FINDINGS: The heart size and mediastinal contours are within normal limits. Both lungs are clear. The visualized skeletal structures are unremarkable. IMPRESSION: No active cardiopulmonary disease. Electronically Signed   By: Lupita Raider M.D.   On: 07/30/2023 21:27    Procedures Procedures    Medications Ordered in ED Medications  ondansetron (ZOFRAN-ODT) disintegrating tablet 4 mg (4 mg Oral Given 07/30/23 1957)    ED Course/ Medical Decision Making/ A&P                                 Medical Decision Making Amount and/or Complexity of Data Reviewed Radiology: ordered.  Risk Prescription drug management.   36 y.o. female presents to the ED for concern of cough for 2 weeks with a brownish-reddish colored  sputum  Differential diagnosis includes but is not limited to viral URI, COVID, RSV, bronchitis, pneumonia  ED Course:  Patient overall well-appearing.  Talking in full sentences on exam, no shortness of breath.  Vital signs are stable.  Lungs clear to auscultation bilaterally.  COVID, flu, RSV negative today.  Given the cough ongoing for 2 weeks, will obtain chest x-ray for further evaluation. Upon reevaluation patient states she is feeling little  nauseous, patient given Zofran for nausea. Upon re-evaluation, patient still well-appearing.  I discussed that the chest x-ray does not show any signs of pneumonia.  I feel like her symptoms are likely due to a bronchitis.   Impression: Bronchitis  Disposition:  The patient was discharged home with instructions to take Tessalon as needed for cough. Follow-up with PCP or back at urgent care if symptoms have not improved within the next week. Return precautions given.   Imaging Studies ordered: I ordered imaging studies including chest x ray  I independently visualized the imaging with scope of interpretation limited to determining acute life threatening conditions related to emergency care. Imaging showed no consolidations, pleural effusions I agree with the radiologist interpretation              Final Clinical Impression(s) / ED Diagnoses Final diagnoses:  Bronchitis    Rx / DC Orders ED Discharge Orders          Ordered    benzonatate (TESSALON) 100 MG capsule  Every 8 hours        07/30/23 2134              Arabella Merles, PA-C 07/30/23 2143    Melene Plan, DO 07/30/23 2143
